# Patient Record
Sex: Female | Born: 1970 | Race: White | Hispanic: No | Smoking: Current every day smoker
Health system: Southern US, Community
[De-identification: ages and names within clinical notes are randomized; demographics above are authoritative.]

## PROBLEM LIST (undated history)

## (undated) DIAGNOSIS — Z9889 Other specified postprocedural states: Secondary | ICD-10-CM

## (undated) DIAGNOSIS — D6800 Von Willebrand disease, unspecified: Secondary | ICD-10-CM

## (undated) DIAGNOSIS — I10 Essential (primary) hypertension: Secondary | ICD-10-CM

## (undated) DIAGNOSIS — E785 Hyperlipidemia, unspecified: Secondary | ICD-10-CM

## (undated) DIAGNOSIS — F419 Anxiety disorder, unspecified: Secondary | ICD-10-CM

## (undated) DIAGNOSIS — E119 Type 2 diabetes mellitus without complications: Secondary | ICD-10-CM

## (undated) DIAGNOSIS — R112 Nausea with vomiting, unspecified: Secondary | ICD-10-CM

## (undated) DIAGNOSIS — K219 Gastro-esophageal reflux disease without esophagitis: Secondary | ICD-10-CM

## (undated) HISTORY — DX: Hyperlipidemia, unspecified: E78.5

## (undated) HISTORY — PX: CHOLECYSTECTOMY: SHX55

## (undated) HISTORY — PX: MOUTH SURGERY: SHX715

## (undated) HISTORY — PX: TONSILLECTOMY: SUR1361

---

## 1998-04-28 ENCOUNTER — Other Ambulatory Visit: Admission: RE | Admit: 1998-04-28 | Discharge: 1998-04-28 | Payer: Self-pay | Admitting: Gynecology

## 2001-10-22 ENCOUNTER — Other Ambulatory Visit: Admission: RE | Admit: 2001-10-22 | Discharge: 2001-10-22 | Payer: Self-pay | Admitting: Gynecology

## 2002-11-24 ENCOUNTER — Other Ambulatory Visit: Admission: RE | Admit: 2002-11-24 | Discharge: 2002-11-24 | Payer: Self-pay | Admitting: Gynecology

## 2003-12-22 ENCOUNTER — Other Ambulatory Visit: Admission: RE | Admit: 2003-12-22 | Discharge: 2003-12-22 | Payer: Self-pay | Admitting: Gynecology

## 2004-11-09 ENCOUNTER — Ambulatory Visit: Payer: Self-pay | Admitting: Family Medicine

## 2005-03-20 ENCOUNTER — Other Ambulatory Visit: Admission: RE | Admit: 2005-03-20 | Discharge: 2005-03-20 | Payer: Self-pay | Admitting: Gynecology

## 2005-04-05 ENCOUNTER — Encounter: Admission: RE | Admit: 2005-04-05 | Discharge: 2005-04-05 | Payer: Self-pay | Admitting: Gynecology

## 2005-06-14 ENCOUNTER — Ambulatory Visit: Payer: Self-pay | Admitting: Internal Medicine

## 2006-05-08 ENCOUNTER — Other Ambulatory Visit: Admission: RE | Admit: 2006-05-08 | Discharge: 2006-05-08 | Payer: Self-pay | Admitting: Gynecology

## 2007-05-13 ENCOUNTER — Other Ambulatory Visit: Admission: RE | Admit: 2007-05-13 | Discharge: 2007-05-13 | Payer: Self-pay | Admitting: Gynecology

## 2010-01-28 ENCOUNTER — Emergency Department (HOSPITAL_COMMUNITY): Admission: EM | Admit: 2010-01-28 | Discharge: 2010-01-28 | Payer: Self-pay | Admitting: Emergency Medicine

## 2010-04-19 ENCOUNTER — Encounter: Admission: RE | Admit: 2010-04-19 | Discharge: 2010-04-19 | Payer: Self-pay | Admitting: Internal Medicine

## 2010-12-11 ENCOUNTER — Encounter: Payer: Self-pay | Admitting: Internal Medicine

## 2011-02-13 LAB — URINE MICROSCOPIC-ADD ON

## 2011-02-13 LAB — POCT CARDIAC MARKERS
CKMB, poc: <1 ng/mL — ABNORMAL LOW (ref 1.0–8.0)
Myoglobin, poc: 77.7 ng/mL (ref 12–200)
Troponin i, poc: <0.05 ng/mL (ref 0.00–0.09)

## 2011-02-13 LAB — DIFFERENTIAL
Eosinophils Absolute: 0.2 10*3/uL (ref 0.0–0.7)
Lymphs Abs: 2.7 10*3/uL (ref 0.7–4.0)
Neutro Abs: 4.7 10*3/uL (ref 1.7–7.7)
Neutrophils Relative %: 56 % (ref 43–77)

## 2011-02-13 LAB — BASIC METABOLIC PANEL
BUN: 9 mg/dL (ref 6–23)
CO2: 27 mEq/L (ref 19–32)
Calcium: 9.2 mg/dL (ref 8.4–10.5)
Creatinine, Ser: 0.83 mg/dL (ref 0.4–1.2)
GFR calc Af Amer: >60 mL/min (ref >60)

## 2011-02-13 LAB — URINALYSIS, ROUTINE W REFLEX MICROSCOPIC
Bilirubin Urine: NEGATIVE
Glucose, UA: NEGATIVE mg/dL
Specific Gravity, Urine: 1.03 (ref 1.005–1.030)
pH: 5 (ref 5.0–8.0)

## 2011-02-13 LAB — CBC
MCHC: 33.5 g/dL (ref 30.0–36.0)
Platelets: 186 10*3/uL (ref 150–400)
RBC: 5.44 MIL/uL — ABNORMAL HIGH (ref 3.87–5.11)
RDW: 13.7 % (ref 11.5–15.5)

## 2013-10-02 ENCOUNTER — Emergency Department (HOSPITAL_COMMUNITY)
Admission: EM | Admit: 2013-10-02 | Discharge: 2013-10-02 | Disposition: A | Discharge status: Home / Self Care | Payer: 59 | Attending: Emergency Medicine | Admitting: Emergency Medicine

## 2013-10-02 ENCOUNTER — Emergency Department (HOSPITAL_COMMUNITY): Payer: 59

## 2013-10-02 ENCOUNTER — Encounter (HOSPITAL_COMMUNITY): Payer: Self-pay | Admitting: Emergency Medicine

## 2013-10-02 DIAGNOSIS — W1809XA Striking against other object with subsequent fall, initial encounter: Secondary | ICD-10-CM | POA: Insufficient documentation

## 2013-10-02 DIAGNOSIS — I10 Essential (primary) hypertension: Secondary | ICD-10-CM | POA: Insufficient documentation

## 2013-10-02 DIAGNOSIS — Y9289 Other specified places as the place of occurrence of the external cause: Secondary | ICD-10-CM | POA: Insufficient documentation

## 2013-10-02 DIAGNOSIS — S060X9A Concussion with loss of consciousness of unspecified duration, initial encounter: Secondary | ICD-10-CM | POA: Insufficient documentation

## 2013-10-02 DIAGNOSIS — F17200 Nicotine dependence, unspecified, uncomplicated: Secondary | ICD-10-CM | POA: Insufficient documentation

## 2013-10-02 DIAGNOSIS — Y939 Activity, unspecified: Secondary | ICD-10-CM | POA: Insufficient documentation

## 2013-10-02 DIAGNOSIS — Z79899 Other long term (current) drug therapy: Secondary | ICD-10-CM | POA: Insufficient documentation

## 2013-10-02 DIAGNOSIS — R112 Nausea with vomiting, unspecified: Secondary | ICD-10-CM | POA: Insufficient documentation

## 2013-10-02 DIAGNOSIS — S069X9A Unspecified intracranial injury with loss of consciousness of unspecified duration, initial encounter: Secondary | ICD-10-CM

## 2013-10-02 HISTORY — DX: Essential (primary) hypertension: I10

## 2013-10-02 MED ORDER — HYDROCODONE-ACETAMINOPHEN 5-325 MG PO TABS: 1.0000 | ORAL_TABLET | Freq: Four times a day (QID) | ORAL | Status: DC | PRN

## 2013-10-02 MED ORDER — IBUPROFEN 600 MG PO TABS: 600.0000 mg | ORAL_TABLET | Freq: Four times a day (QID) | ORAL | Status: DC | PRN

## 2013-10-02 NOTE — ED Provider Notes (Signed)
CSN: [id number]     Arrival date & time 10/02/13  1144 History   First MD Initiated Contact with Patient 10/02/13 1157     No chief complaint on file.  (Consider location/radiation/quality/duration/timing/severity/associated sxs/prior Treatment) HPI  This a 42 year old female who presents following a fall yesterday. She fell from a standing height and hit her head on a rock wall. She didn't lose consciousness. She had one episode of emesis approximately 20 minutes following a fall. Since that time she has had increasing left-sided headache it is worse with jaw opening. She denies any vision changes, weakness, numbness or tingling. She denies any further episodes of emesis but does endorse nausea. Patient denies any other injury. She called her primary care physician this morning who referred her to the ER for CT scan.  Past Medical History  Diagnosis Date  . Hypertension    Past Surgical History  Procedure Laterality Date  . Tonsillectomy    . Mouth surgery     No family history on file. History  Substance Use Topics  . Smoking status: Current Every Day Smoker -- 0.50 packs/day    Types: Cigarettes  . Smokeless tobacco: Not on file  . Alcohol Use: Yes   OB History   Grav Para Term Preterm Abortions TAB SAB Ect Mult Living                 Review of Systems  Respiratory: Negative for chest tightness and shortness of breath.   Cardiovascular: Negative for chest pain.  Gastrointestinal: Positive for nausea and vomiting. Negative for abdominal distention.  Musculoskeletal: Negative for neck pain.  Skin: Negative for wound.  Neurological: Positive for headaches.    Allergies  Phenergan  Home Medications   Current Outpatient Rx  Name  Route  Sig  Dispense  Refill  . acetaminophen (TYLENOL) 325 MG tablet   Oral   Take 650 mg by mouth every 6 (six) hours as needed.         . citalopram (CELEXA) 10 MG tablet   Oral   Take 10 mg by mouth daily.         Marland Kitchen etonogestrel  (IMPLANON) 68 MG IMPL implant   Subcutaneous   Inject 1 each into the skin once.         Marland Kitchen losartan (COZAAR) 25 MG tablet   Oral   Take 25 mg by mouth daily.         Marland Kitchen omeprazole (PRILOSEC) 40 MG capsule   Oral   Take 40 mg by mouth daily.         Marland Kitchen HYDROcodone-acetaminophen (NORCO/VICODIN) 5-325 MG per tablet   Oral   Take 1 tablet by mouth every 6 (six) hours as needed.   10 tablet   0   . ibuprofen (ADVIL,MOTRIN) 600 MG tablet   Oral   Take 1 tablet (600 mg total) by mouth every 6 (six) hours as needed.   30 tablet   0    BP 158/63  Pulse 88  Temp(Src) 98.3 F (36.8 C)  Resp 18  SpO2 96% Physical Exam  Nursing note and vitals reviewed. Constitutional: She is oriented to person, place, and time. She appears well-developed and well-nourished. No distress.  HENT:  Head: Normocephalic and atraumatic.  No evidence of hematoma or contusion to the left scalp or 4 head. There is tenderness to palpation. Midface is stable. Jaw appears aligned and there is no evidence of trismus on exam.  Eyes: EOM are normal. Pupils  are equal, round, and reactive to light.  Neck: Neck supple.  No cervical spine tenderness  Cardiovascular: Normal rate, regular rhythm and normal heart sounds.   No murmur heard. Pulmonary/Chest: Effort normal and breath sounds normal. No respiratory distress.  Abdominal: Soft. There is no tenderness.  Neurological: She is alert and oriented to person, place, and time.  5 Out of 5 strength in all 4 extremity  Skin: Skin is warm and dry.  Psychiatric: She has a normal mood and affect.    ED Course  Procedures (including critical care time) Labs Review Labs Reviewed - No data to display Imaging Review Ct Head Wo Contrast  10/02/2013   CLINICAL DATA:  Fall with loss consciousness and vomiting  EXAM: CT HEAD WITHOUT CONTRAST  TECHNIQUE: Contiguous axial images were obtained from the base of the skull through the vertex without intravenous contrast.   COMPARISON:  CT 01/28/2010  FINDINGS: Ventricle size is normal. Negative for acute or chronic infarction. Negative for hemorrhage or fluid collection. Negative for mass or edema. No shift of the midline structures.  Calvarium is intact.  IMPRESSION: Normal   Electronically Signed   By: Marlan Palau M.D.   On: 10/02/2013 13:14    EKG Interpretation   None       MDM   1. Minor head injury with loss of consciousness, initial encounter    Patient presents following a fall approximately 14 hours ago. She's not on any anticoagulants. However, she did have loss of consciousness and has had one episode of vomiting. She is nontoxic on exam and vital signs are reassuring. She has no evidence of hematoma or laceration to the scalp. I have low suspicion for intracranial pathology at this time. However, given that she has vomited and was referred by her primary doctor for CT scanning, patient was sent for CT scan of the head. CT is negative. She continues to be neurologically intact. Patient will followup with her primary doctor. She will be given a short course of pain medication.  After history, exam, and medical workup I feel the patient has been appropriately medically screened and is safe for discharge home. Pertinent diagnoses were discussed with the patient. Patient was given return precautions.     Shon Baton, MD 10/02/13 1344

## 2013-10-02 NOTE — ED Notes (Signed)
Pt sts fell yesterday in her garden and hit left side of her head on a rock. Pt sts LOC for short time. Today she called her PCP and was told to come to ED for scans. Pt reports swelling and pain on the left side of head as well as nausea.

## 2013-10-02 NOTE — ED Notes (Signed)
Pt states she fell, hit her head and lost consciousness last night at 10pm. Vomited once after fall. Minor abrasion to left side of scalp. Pt reports swelling. Called her pcp her advised her to come into ED for CT scan

## 2015-12-26 ENCOUNTER — Other Ambulatory Visit: Payer: Self-pay

## 2015-12-26 ENCOUNTER — Emergency Department (HOSPITAL_COMMUNITY)
Admission: EM | Admit: 2015-12-26 | Discharge: 2015-12-27 | Disposition: A | Discharge status: Home / Self Care | Payer: Self-pay | Attending: Emergency Medicine | Admitting: Emergency Medicine

## 2015-12-26 ENCOUNTER — Emergency Department (HOSPITAL_COMMUNITY): Payer: Self-pay

## 2015-12-26 ENCOUNTER — Encounter (HOSPITAL_COMMUNITY): Payer: Self-pay | Admitting: Emergency Medicine

## 2015-12-26 DIAGNOSIS — I10 Essential (primary) hypertension: Secondary | ICD-10-CM | POA: Insufficient documentation

## 2015-12-26 DIAGNOSIS — F17210 Nicotine dependence, cigarettes, uncomplicated: Secondary | ICD-10-CM | POA: Insufficient documentation

## 2015-12-26 DIAGNOSIS — K219 Gastro-esophageal reflux disease without esophagitis: Secondary | ICD-10-CM | POA: Insufficient documentation

## 2015-12-26 DIAGNOSIS — Z79899 Other long term (current) drug therapy: Secondary | ICD-10-CM | POA: Insufficient documentation

## 2015-12-26 DIAGNOSIS — K8020 Calculus of gallbladder without cholecystitis without obstruction: Secondary | ICD-10-CM | POA: Insufficient documentation

## 2015-12-26 DIAGNOSIS — R011 Cardiac murmur, unspecified: Secondary | ICD-10-CM | POA: Insufficient documentation

## 2015-12-26 MED ORDER — ONDANSETRON HCL 4 MG/2ML IJ SOLN
4.0000 mg | Freq: Once | INTRAMUSCULAR | Status: AC
  Administered 2015-12-26: 4 mg via INTRAVENOUS
  Filled 2015-12-26: qty 2

## 2015-12-26 MED ORDER — SODIUM CHLORIDE 0.9 % IV BOLUS (SEPSIS)
500.0000 mL | Freq: Once | INTRAVENOUS | Status: AC
  Administered 2015-12-27: 500 mL via INTRAVENOUS

## 2015-12-26 MED ORDER — HYDROMORPHONE HCL 1 MG/ML IJ SOLN
1.0000 mg | Freq: Once | INTRAMUSCULAR | Status: AC
  Administered 2015-12-27: 1 mg via INTRAVENOUS
  Filled 2015-12-26: qty 1

## 2015-12-26 NOTE — ED Notes (Signed)
Per EMS pt reported that she began to experience epigastric pain, two hours prior she had eaten a slice of pizza and had a glass of wine.  Antiacids has not worked to relieve pain, she also reports that she has "thrown up 6X".  EMS gave her 1 nitro and 324 ASA which decreased her pain from a 10 out of 10 to a 7 out of 10.

## 2015-12-26 NOTE — ED Provider Notes (Signed)
CSN: [id number]     Arrival date & time 12/26/15  2253 History  By signing my name below, I, Budd Palmer, attest that this documentation has been prepared under the direction and in the presence of Gilda Crease, MD. Electronically Signed: Budd Palmer, ED Scribe. 12/27/2015. 12:02 AM.    Chief Complaint  Patient presents with  . Abdominal Pain   The history is provided by the patient. No language interpreter was used.   HPI Comments: Donna Freeman is a 45 y.o. female smoker at 0.5 ppd with a PMHx of HTN brought in by ambulance, who presents to the Emergency Department complaining of non-radiating, epigastric abdominal soreness of sudden onset less than an hour ago. Pt states she was watching TV when the pain began. She notes she ate some pizza over an hour before onset. She reports associated vomiting (6x) and SOB, which has since resolved. She denies any exacerbating or alleviating factors. She reports a PMhx of GERD for which she takes medication daily. She notes a FHx of heart problems and a personal PMHx of heart murmur. Pt denies a PMHx of DM and a PSHx of cholecystectomy.  Pt is allergic to phenergan.   Past Medical History  Diagnosis Date  . Hypertension    Past Surgical History  Procedure Laterality Date  . Tonsillectomy    . Mouth surgery     No family history on file. Social History  Substance Use Topics  . Smoking status: Current Every Day Smoker -- 0.50 packs/day    Types: Cigarettes  . Smokeless tobacco: None  . Alcohol Use: Yes   OB History    No data available     Review of Systems  Respiratory: Positive for shortness of breath (resolved).   Gastrointestinal: Positive for vomiting and abdominal pain.  All other systems reviewed and are negative.  Allergies  Phenergan  Home Medications   Prior to Admission medications   Medication Sig Start Date End Date Taking? Authorizing Provider  acetaminophen (TYLENOL) 325 MG tablet Take 650 mg by mouth  every 6 (six) hours as needed for mild pain.    Yes Historical Provider, MD  cetirizine (ZYRTEC) 10 MG tablet Take 10 mg by mouth daily.   Yes Historical Provider, MD  citalopram (CELEXA) 10 MG tablet Take 10 mg by mouth daily.   Yes Historical Provider, MD  etonogestrel (IMPLANON) 68 MG IMPL implant Inject 1 each into the skin once.   Yes Historical Provider, MD  losartan (COZAAR) 25 MG tablet Take 25 mg by mouth daily.   Yes Historical Provider, MD  omeprazole (PRILOSEC) 40 MG capsule Take 40 mg by mouth daily.   Yes Historical Provider, MD  HYDROcodone-acetaminophen (NORCO/VICODIN) 5-325 MG tablet Take 2 tablets by mouth every 4 (four) hours as needed for moderate pain. 12/27/15   Gilda Crease, MD  ondansetron (ZOFRAN) 4 MG tablet Take 1 tablet (4 mg total) by mouth every 6 (six) hours. 12/27/15   Gilda Crease, MD   BP 144/74 mmHg  Pulse 73  Temp(Src) 98.2 F (36.8 C) (Oral)  Resp 17  SpO2 96% Physical Exam  Constitutional: She is oriented to person, place, and time. She appears well-developed and well-nourished. No distress.  HENT:  Head: Normocephalic and atraumatic.  Right Ear: Hearing normal.  Left Ear: Hearing normal.  Nose: Nose normal.  Mouth/Throat: Oropharynx is clear and moist and mucous membranes are normal.  Eyes: Conjunctivae and EOM are normal. Pupils are equal, round, and reactive  to light.  Neck: Normal range of motion. Neck supple.  Cardiovascular: Regular rhythm, S1 normal and S2 normal.  Exam reveals no gallop and no friction rub.   No murmur heard. Pulmonary/Chest: Effort normal and breath sounds normal. No respiratory distress. She exhibits no tenderness.  Abdominal: Soft. Normal appearance and bowel sounds are normal. There is no hepatosplenomegaly. There is tenderness. There is no rebound, no guarding, no tenderness at McBurney's point and negative Murphy's sign. No hernia.  Epigastric TTP  Musculoskeletal: Normal range of motion.  Neurological:  She is alert and oriented to person, place, and time. She has normal strength. No cranial nerve deficit or sensory deficit. Coordination normal. GCS eye subscore is 4. GCS verbal subscore is 5. GCS motor subscore is 6.  Skin: Skin is warm, dry and intact. No rash noted. No cyanosis.  Psychiatric: She has a normal mood and affect. Her speech is normal and behavior is normal. Thought content normal.  Nursing note and vitals reviewed.   ED Course  Procedures  DIAGNOSTIC STUDIES: Oxygen Saturation is 95% on RA, adequate by my interpretation.    COORDINATION OF CARE: 11:18 PM - Discussed plans to order diagnostic studies and imaging. Pt advised of plan for treatment and pt agrees.  Labs Review Labs Reviewed  CBC WITH DIFFERENTIAL/PLATELET - Abnormal; Notable for the following:    RBC 5.69 (*)    Hemoglobin 16.1 (*)    HCT 48.6 (*)    All other components within normal limits  COMPREHENSIVE METABOLIC PANEL - Abnormal; Notable for the following:    Potassium 3.2 (*)    Glucose, Bld 128 (*)    All other components within normal limits  URINALYSIS, ROUTINE W REFLEX MICROSCOPIC (NOT AT [facility]) - Abnormal; Notable for the following:    Color, Urine AMBER (*)    APPearance CLOUDY (*)    Hgb urine dipstick SMALL (*)    Bilirubin Urine SMALL (*)    Ketones, ur 15 (*)    All other components within normal limits  URINE MICROSCOPIC-ADD ON - Abnormal; Notable for the following:    Squamous Epithelial / LPF 6-30 (*)    Bacteria, UA RARE (*)    All other components within normal limits  LIPASE, BLOOD  TROPONIN I  TROPONIN I    Imaging Review Dg Abd Acute W/chest  12/26/2015  CLINICAL DATA:  45 year old female with epigastric pain EXAM: DG ABDOMEN ACUTE W/ 1V CHEST COMPARISON:  Chest radiograph dated 01/28/2010 FINDINGS: Moderate stool throughout the colon. There is no evidence of dilated bowel loops or free intraperitoneal air. No radiopaque calculi or other significant radiographic abnormality  is seen. Heart size and mediastinal contours are within normal limits. Both lungs are clear. IMPRESSION: Negative abdominal radiographs.  No acute cardiopulmonary disease. Electronically Signed   By: Elgie Collard M.D.   On: 12/26/2015 23:50   US Abdomen Limited Ruq  12/27/2015  CLINICAL DATA:  45 year old female with upper abdominal pain EXAM: US ABDOMEN LIMITED - RIGHT UPPER QUADRANT COMPARISON:  None. FINDINGS: Gallbladder: There are stones within the gallbladder. Eight stone is noted in the gallbladder neck. The gallbladder wall is thickened and measures 5 mm in diameter. Areas of increased gallbladder wall echogenicity with comet tail artifact compatible with adenomyomatosis. There is no pericholecystic fluid. Negative sonographic Murphy's sign. Common bile duct: Diameter: 6 mm Liver: No focal lesion identified. Within normal limits in parenchymal echogenicity. IMPRESSION: Cholelithiasis with thickening of the gallbladder wall may represent an acute or chronic cholecystitis.  A hepatobiliary scintigraphy may provide better evaluation of the gallbladder if an acute cholecystitis is clinically suspected. Electronically Signed   By: Elgie Collard M.D.   On: 12/27/2015 03:09   I have personally reviewed and evaluated these images and lab results as part of my medical decision-making.   EKG Interpretation   Date/Time:  Sunday December 26 2015 22:52:41 EST Ventricular Rate:  73 PR Interval:  144 QRS Duration: 90 QT Interval:  422 QTC Calculation: 464 R Axis:   98 Text Interpretation:  Normal sinus rhythm Rightward axis No significant  change since last tracing Confirmed by Edwing Figley  MD, Cletis Clack [id number]) on  12/27/2015 4:02:31 AM      MDM   Final diagnoses:  Calculus of gallbladder without cholecystitis without obstruction   01:05 - recheck, patient not experiencing pain after dilaudid.  Patient presented to the emergency department for evaluation of upper abdominal pain. Patient had  sudden onset of sharp and stabbing pains in the center of her upper abdomen prior to arrival. This was accompanied with nausea and vomiting. She concerned because she has a significant family history of heart disease. Presentation was atypical for cardiac etiology. Her EKG, troponin 2 have been negative.  Based on the epigastric discomfort, gallbladder disease was considered. Ultrasound does show cholelithiasis with slightly thickened gallbladder wall of 5 mm. Patient presentation is not consistent with acute cholecystitis at this time. She has had complete resolution of pain with a single dose of Dilaudid. Repeat examination is benign, no Murphy's sign. She does not require hospitalization or emergent surgery for this. She is appropriate for outpatient follow-up with general surgery. Patient was provided antiemetics, analgesia. She was counseled return to the ER for any recurrent episodes of pain.  I personally performed the services described in this documentation, which was scribed in my presence. The recorded information has been reviewed and is accurate.   Gilda Crease, MD 12/27/15 [phone]

## 2015-12-26 NOTE — ED Notes (Signed)
B

## 2015-12-27 ENCOUNTER — Emergency Department (HOSPITAL_COMMUNITY): Payer: Self-pay

## 2015-12-27 LAB — CBC WITH DIFFERENTIAL/PLATELET
BASOS ABS: 0 10*3/uL (ref 0.0–0.1)
BASOS PCT: 0 %
EOS ABS: 0.1 10*3/uL (ref 0.0–0.7)
Eosinophils Relative: 1 %
HCT: 48.6 % — ABNORMAL HIGH (ref 36.0–46.0)
Hemoglobin: 16.1 g/dL — ABNORMAL HIGH (ref 12.0–15.0)
Lymphocytes Relative: 24 %
Lymphs Abs: 2.4 10*3/uL (ref 0.7–4.0)
MCH: 28.3 pg (ref 26.0–34.0)
MCHC: 33.1 g/dL (ref 30.0–36.0)
MCV: 85.4 fL (ref 78.0–100.0)
MONO ABS: 0.8 10*3/uL (ref 0.1–1.0)
Monocytes Relative: 8 %
NEUTROS PCT: 67 %
Neutro Abs: 6.8 10*3/uL (ref 1.7–7.7)
PLATELETS: 219 10*3/uL (ref 150–400)
RBC: 5.69 MIL/uL — ABNORMAL HIGH (ref 3.87–5.11)
RDW: 13.7 % (ref 11.5–15.5)
WBC: 10.1 10*3/uL (ref 4.0–10.5)

## 2015-12-27 LAB — TROPONIN I
Troponin I: <0.03 ng/mL (ref <0.031)
Troponin I: <0.03 ng/mL (ref <0.031)

## 2015-12-27 LAB — COMPREHENSIVE METABOLIC PANEL
ALBUMIN: 4.1 g/dL (ref 3.5–5.0)
ALK PHOS: 69 U/L (ref 38–126)
ALT: 23 U/L (ref 14–54)
AST: 28 U/L (ref 15–41)
Anion gap: 15 (ref 5–15)
BILIRUBIN TOTAL: 0.6 mg/dL (ref 0.3–1.2)
BUN: 7 mg/dL (ref 6–20)
CALCIUM: 9.6 mg/dL (ref 8.9–10.3)
CO2: 24 mmol/L (ref 22–32)
CREATININE: 0.96 mg/dL (ref 0.44–1.00)
Chloride: 102 mmol/L (ref 101–111)
GFR calc Af Amer: >60 mL/min (ref >60)
GLUCOSE: 128 mg/dL — AB (ref 65–99)
POTASSIUM: 3.2 mmol/L — AB (ref 3.5–5.1)
SODIUM: 141 mmol/L (ref 135–145)
TOTAL PROTEIN: 6.8 g/dL (ref 6.5–8.1)

## 2015-12-27 LAB — URINALYSIS, ROUTINE W REFLEX MICROSCOPIC
GLUCOSE, UA: NEGATIVE mg/dL
KETONES UR: 15 mg/dL — AB
LEUKOCYTES UA: NEGATIVE
Nitrite: NEGATIVE
PH: 5 (ref 5.0–8.0)
PROTEIN: NEGATIVE mg/dL
Specific Gravity, Urine: 1.026 (ref 1.005–1.030)

## 2015-12-27 LAB — LIPASE, BLOOD: LIPASE: 22 U/L (ref 11–51)

## 2015-12-27 LAB — URINE MICROSCOPIC-ADD ON

## 2015-12-27 MED ORDER — ONDANSETRON HCL 4 MG PO TABS: 4.0000 mg | ORAL_TABLET | Freq: Four times a day (QID) | ORAL | Status: DC

## 2015-12-27 MED ORDER — HYDROCODONE-ACETAMINOPHEN 5-325 MG PO TABS: 2.0000 | ORAL_TABLET | ORAL | Status: DC | PRN

## 2015-12-27 NOTE — Discharge Instructions (Signed)

## 2015-12-28 ENCOUNTER — Other Ambulatory Visit: Payer: Self-pay | Admitting: Surgery

## 2015-12-28 ENCOUNTER — Encounter (HOSPITAL_COMMUNITY): Payer: Self-pay | Admitting: *Deleted

## 2015-12-28 NOTE — H&P (Signed)
Donna Freeman 12/28/2015 9:05 AM Location: [facility] Patient #: [id number] DOB: 04-Sep-1971 Single / Language: [place] / Race: White Female  Patient Care Team: Gaspar Garbe, MD as PCP - General (Internal Medicine) Karie Soda, MD as Consulting Physician (General Surgery)   History of Present Illness Donna Sportsman MD; 12/28/2015 9:59 AM) The patient is a 45 year old female who presents for evaluation of gall stones. Note for "Gall stones": Patient sent for surgical consultation by Dr. Alice Reichert with, [facility]. Concern for symptomatic gallstones.  Pleasant woman. Comes today with her mother. Had episode of severe epigastric pain yesterday. Thought she was having a heart attack. Went to the emergency room. Negative cardiac and pulmonary workup. Began to have severe nausea with vomiting. Had pizza and one that night. Usually does not have much problems with eating. Does have chronic heartburn and reflux that is well controlled with Prilosec only. She did have evidence of gallstones and some concern of inflammation. However her pain resolved with one dose of narcotic and nausea meds. She did not have leukocytosis. It was recommend that she consdier a cholecystectomy. She is wanting this thing out now.  She already has a bowel movement twice a day. She does smoke a little less than a pack a day. She can walk an hour without difficulty. She's never had abdominal surgeries. No change in diet. No personal nor family history of GI/colon cancer, inflammatory bowel disease, irritable bowel syndrome, allergy such as Celiac Sprue, dietary/dairy problems, colitis, ulcers nor gastritis. No recent sick contacts/gastroenteritis. No travel outside the country. No changes in diet. No dysphagia to solids or liquids. No significant heartburn or reflux. No hematochezia, hematemesis, coffee ground emesis. No evidence of prior gastric/peptic  ulceration.    CLINICAL DATA: 45 year old female with upper abdominal pain  EXAM: US ABDOMEN LIMITED - RIGHT UPPER QUADRANT  COMPARISON: None.  FINDINGS: Gallbladder:  There are stones within the gallbladder. Eight stone is noted in the gallbladder neck. The gallbladder wall is thickened and measures 5 mm in diameter. Areas of increased gallbladder wall echogenicity with comet tail artifact compatible with adenomyomatosis. There is no pericholecystic fluid. Negative sonographic Murphy's sign.  Common bile duct:  Diameter: 6 mm  Liver:  No focal lesion identified. Within normal limits in parenchymal echogenicity.  IMPRESSION: Cholelithiasis with thickening of the gallbladder wall may represent an acute or chronic cholecystitis. A hepatobiliary scintigraphy may provide better evaluation of the gallbladder if an acute cholecystitis is clinically suspected.   Electronically Signed By: Elgie Collard M.D. On: 12/27/2015 03:09   Other Problems Fay Records, CMA; 12/28/2015 9:05 AM) Anxiety Disorder Back Pain Cholelithiasis Gastroesophageal Reflux Disease Heart murmur High blood pressure Migraine Headache  Past Surgical History Fay Records, CMA; 12/28/2015 9:05 AM) Oral Surgery Tonsillectomy  Diagnostic Studies History Fay Records, CMA; 12/28/2015 9:05 AM) Colonoscopy never Mammogram within last year Pap Smear 1-5 years ago  Allergies Fay Records, CMA; 12/28/2015 9:06 AM) Phenergan *ANTIHISTAMINES*  Medication History Fay Records, CMA; 12/28/2015 9:07 AM) Omeprazole (  Capsule DR, Oral) Active. Losartan Potassium-HCTZ (50-12.5MG  Tablet, Oral) Active. Tylenol (  Tablet, Oral) Active. ZyrTEC (  Tablet, Oral) Active. CeleXA (  Tablet, Oral) Active. Implanon (  Implant, Subcutaneous) Active. Hydrocodone-Acetaminophen (5-325MG  Tablet, Oral) Active. Zofran (  Tablet, Oral) Active. Medications Reconciled  Social  History Fay Records, [place]; 12/28/2015 9:05 AM) Alcohol use Moderate alcohol use. Caffeine use Carbonated beverages, Coffee, Tea. Illicit drug use Remotely quit drug use. Tobacco use Current every day smoker.  Family History (  Fay Records, CMA; 12/28/2015 9:05 AM) Arthritis Mother. Cerebrovascular Accident Mother. Heart Disease Mother. Heart disease in female family member before age 47 Heart disease in female family member before age 91 Hypertension Mother. Migraine Headache Mother. Thyroid problems Mother.  Pregnancy / Birth History Fay Records, CMA; 12/28/2015 9:05 AM) Age at menarche 11 years. Contraceptive History Contraceptive implant, Depo-provera, Oral contraceptives. Gravida 0 Irregular periods     Review of Systems Fay Records CMA; 12/28/2015 9:05 AM) General Not Present- Appetite Loss, Chills, Fatigue, Fever, Night Sweats, Weight Gain and Weight Loss. Skin Not Present- Change in Wart/Mole, Dryness, Hives, Jaundice, New Lesions, Non-Healing Wounds, Rash and Ulcer. HEENT Not Present- Earache, Hearing Loss, Hoarseness, Nose Bleed, Oral Ulcers, Ringing in the Ears, Seasonal Allergies, Sinus Pain, Sore Throat, Visual Disturbances, Wears glasses/contact lenses and Yellow Eyes. Respiratory Not Present- Bloody sputum, Chronic Cough, Difficulty Breathing, Snoring and Wheezing. Breast Not Present- Breast Mass, Breast Pain, Nipple Discharge and Skin Changes. Cardiovascular Not Present- Chest Pain, Difficulty Breathing Lying Down, Leg Cramps, Palpitations, Rapid Heart Rate, Shortness of Breath and Swelling of Extremities. Gastrointestinal Present- Abdominal Pain and Nausea. Not Present- Bloating, Bloody Stool, Change in Bowel Habits, Chronic diarrhea, Constipation, Difficulty Swallowing, Excessive gas, Gets full quickly at meals, Hemorrhoids, Indigestion, Rectal Pain and Vomiting. Female Genitourinary Not Present- Frequency, Nocturia, Painful Urination, Pelvic Pain and  Urgency. Musculoskeletal Not Present- Back Pain, Joint Pain, Joint Stiffness, Muscle Pain, Muscle Weakness and Swelling of Extremities. Neurological Not Present- Decreased Memory, Fainting, Headaches, Numbness, Seizures, Tingling, Tremor, Trouble walking and Weakness. Psychiatric Not Present- Anxiety, Bipolar, Change in Sleep Pattern, Depression, Fearful and Frequent crying. Endocrine Not Present- Cold Intolerance, Excessive Hunger, Hair Changes, Heat Intolerance, Hot flashes and New Diabetes. Hematology Not Present- Easy Bruising, Excessive bleeding, Gland problems, HIV and Persistent Infections.  Vitals Fay Records CMA; 12/28/2015 9:07 AM) 12/28/2015 9:07 AM Weight: 207 lb Height: 67in Body Surface Area: 2.05 m Body Mass Index: 32.42 kg/m  Temp.: 97.37F(Temporal)  Pulse: 87 (Regular)  BP: 128/78 (Sitting, Left Arm, Standard)      Physical Exam Donna Sportsman MD; 12/28/2015 9:55 AM)  General Mental Status-Alert. General Appearance-Not in acute distress, Not Sickly. Orientation-Oriented X3. Hydration-Well hydrated. Voice-Normal.  Integumentary Global Assessment Upon inspection and palpation of skin surfaces of the - Axillae: non-tender, no inflammation or ulceration, no drainage. and Distribution of scalp and body hair is normal. General Characteristics Temperature - normal warmth is noted.  Head and Neck Head-normocephalic, atraumatic with no lesions or palpable masses. Face Global Assessment - atraumatic, no absence of expression. Neck Global Assessment - no abnormal movements, no bruit auscultated on the right, no bruit auscultated on the left, no decreased range of motion, non-tender. Trachea-midline. Thyroid Gland Characteristics - non-tender.  Eye Eyeball - Left-Extraocular movements intact, No Nystagmus. Eyeball - Right-Extraocular movements intact, No Nystagmus. Cornea - Left-No Hazy. Cornea - Right-No Hazy. Sclera/Conjunctiva  - Left-No scleral icterus, No Discharge. Sclera/Conjunctiva - Right-No scleral icterus, No Discharge. Pupil - Left-Direct reaction to light normal. Pupil - Right-Direct reaction to light normal. Note: Wears glasses  ENMT Ears Pinna - Left - no drainage observed, no generalized tenderness observed. Right - no drainage observed, no generalized tenderness observed. Nose and Sinuses External Inspection of the Nose - no destructive lesion observed. Inspection of the nares - Left - quiet respiration. Right - quiet respiration. Mouth and Throat Lips - Upper Lip - no fissures observed, no pallor noted. Lower Lip - no fissures observed, no pallor noted. Nasopharynx - no discharge  present. Oral Cavity/Oropharynx - Tongue - no dryness observed. Oral Mucosa - no cyanosis observed. Hypopharynx - no evidence of airway distress observed.  Chest and Lung Exam Inspection Movements - Normal and Symmetrical. Accessory muscles - No use of accessory muscles in breathing. Palpation Palpation of the chest reveals - Non-tender. Auscultation Breath sounds - Normal and Clear.  Cardiovascular Auscultation Rhythm - Regular. Murmurs & Other Heart Sounds - Auscultation of the heart reveals - No Murmurs and No Systolic Clicks.  Abdomen Inspection Inspection of the abdomen reveals - No Visible peristalsis and No Abnormal pulsations. Umbilicus - No Bleeding, No Urine drainage. Palpation/Percussion Palpation and Percussion of the abdomen reveal - Soft, Non Tender, No Rebound tenderness, No Rigidity (guarding) and No Cutaneous hyperesthesia. Note: Obese but soft. Mild epigastric and medial RIGHT upper quadrant discomfort but no Murphy sign. Rest of the abdomen soft nontender nondistended. No diastases. No umbilical hernia.  Female Genitourinary Sexual Maturity Tanner 5 - Adult hair pattern. Note: No vaginal bleeding nor discharge  Peripheral Vascular Upper Extremity Inspection - Left - No Cyanotic  nailbeds, Not Ischemic. Right - No Cyanotic nailbeds, Not Ischemic.  Neurologic Neurologic evaluation reveals -normal attention span and ability to concentrate, able to name objects and repeat phrases. Appropriate fund of knowledge , normal sensation and normal coordination. Mental Status Affect - not angry, not paranoid. Cranial Nerves-Normal Bilaterally. Gait-Normal.  Neuropsychiatric Mental status exam performed with findings of-able to articulate well with normal speech/language, rate, volume and coherence, thought content normal with ability to perform basic computations and apply abstract reasoning and no evidence of hallucinations, delusions, obsessions or homicidal/suicidal ideation.  Musculoskeletal Global Assessment Spine, Ribs and Pelvis - no instability, subluxation or laxity. Right Upper Extremity - no instability, subluxation or laxity.  Lymphatic Head & Neck  General Head & Neck Lymphatics: Bilateral - Description - No Localized lymphadenopathy. Axillary  General Axillary Region: Bilateral - Description - No Localized lymphadenopathy. Femoral & Inguinal  Generalized Femoral & Inguinal Lymphatics: Left - Description - No Localized lymphadenopathy. Right - Description - No Localized lymphadenopathy.    Assessment & Plan Donna Sportsman MD; 12/28/2015 9:56 AM)  CHRONIC CHOLECYSTITIS WITH CALCULUS (K80.10) Impression: History physical and ultrasound strongly suspicious for biliary colic. Rest of the differential diagnosis seems unlikely. I think she would benefit from cholecystectomy. They're wanting this done as soon as possible.  I recommend she stick to soups and liquids diet for the next 48 hours. Continue the Zofran. See if things will continue to calm down. She is much better now than she was in the ER yesterday. Her affect if she has unbearable symptoms, may need to be admitted for urgent cholecystectomy. However census is high right now. We'll try and calm  this down and do this soon electively.  Current Plans You are being scheduled for surgery - Our schedulers will call you.  You should hear from our office's scheduling department within 5 working days about the location, date, and time of surgery. We try to make accommodations for patient's preferences in scheduling surgery, but sometimes the OR schedule or the surgeon's schedule prevents [place] from making those accommodations.  If you have not heard from our office [phone]) in 5 working days, call the office and ask for your surgeon's nurse.  If you have other questions about your diagnosis, plan, or surgery, call the office and ask for your surgeon's nurse.  The anatomy & physiology of hepatobiliary & pancreatic function was discussed. The pathophysiology of gallbladder dysfunction was  discussed. Natural history risks without surgery was discussed. I feel the risks of no intervention will lead to serious problems that outweigh the operative risks; therefore, I recommended cholecystectomy to remove the pathology. I explained laparoscopic techniques with possible need for an open approach. Probable cholangiogram to evaluate the bilary tract was explained as well.  Risks such as bleeding, infection, abscess, leak, injury to other organs, need for further treatment, heart attack, death, and other risks were discussed. I noted a good likelihood this will help address the problem. Possibility that this will not correct all abdominal symptoms was explained. Goals of post-operative recovery were discussed as well. We will work to minimize complications. An educational handout further explaining the pathology and treatment options was given as well. Questions were answered. The patient expresses understanding & wishes to proceed with surgery.  Pt Education - Pamphlet Given - Laparoscopic Gallbladder Surgery: discussed with patient and provided information. Written instructions provided Pt Education -  [facility] Laparosopic Post Op HCI (Lillybeth Tal) Pt Education - [facility] Good Bowel Health (Nevelyn Mellott) Pt Education - Laparoscopic Cholecystectomy: gallbladder  Donna Freeman, M.D., F.A.C.S. Gastrointestinal and Minimally Invasive Surgery [facility] [address], [address] [place], [place] [address] [phone] Main / Paging

## 2015-12-30 ENCOUNTER — Ambulatory Visit (HOSPITAL_COMMUNITY): Payer: Self-pay | Admitting: Anesthesiology

## 2015-12-30 ENCOUNTER — Encounter (HOSPITAL_COMMUNITY): Payer: Self-pay | Admitting: *Deleted

## 2015-12-30 ENCOUNTER — Encounter (HOSPITAL_COMMUNITY)
Admission: RE | Disposition: A | Discharge status: Home / Self Care | Payer: Self-pay | Source: Ambulatory Visit | Attending: Surgery

## 2015-12-30 ENCOUNTER — Ambulatory Visit (HOSPITAL_COMMUNITY): Payer: Self-pay

## 2015-12-30 ENCOUNTER — Ambulatory Visit (HOSPITAL_COMMUNITY)
Admission: RE | Admit: 2015-12-30 | Discharge: 2015-12-30 | Disposition: A | Discharge status: Home / Self Care | Payer: Self-pay | Source: Ambulatory Visit | Attending: Surgery | Admitting: Surgery

## 2015-12-30 DIAGNOSIS — E669 Obesity, unspecified: Secondary | ICD-10-CM | POA: Insufficient documentation

## 2015-12-30 DIAGNOSIS — K219 Gastro-esophageal reflux disease without esophagitis: Secondary | ICD-10-CM | POA: Insufficient documentation

## 2015-12-30 DIAGNOSIS — I10 Essential (primary) hypertension: Secondary | ICD-10-CM | POA: Insufficient documentation

## 2015-12-30 DIAGNOSIS — F17200 Nicotine dependence, unspecified, uncomplicated: Secondary | ICD-10-CM | POA: Insufficient documentation

## 2015-12-30 DIAGNOSIS — Z79891 Long term (current) use of opiate analgesic: Secondary | ICD-10-CM | POA: Insufficient documentation

## 2015-12-30 DIAGNOSIS — F419 Anxiety disorder, unspecified: Secondary | ICD-10-CM | POA: Insufficient documentation

## 2015-12-30 DIAGNOSIS — Z6832 Body mass index (BMI) 32.0-32.9, adult: Secondary | ICD-10-CM | POA: Insufficient documentation

## 2015-12-30 DIAGNOSIS — K8012 Calculus of gallbladder with acute and chronic cholecystitis without obstruction: Secondary | ICD-10-CM | POA: Insufficient documentation

## 2015-12-30 DIAGNOSIS — Z419 Encounter for procedure for purposes other than remedying health state, unspecified: Secondary | ICD-10-CM

## 2015-12-30 DIAGNOSIS — Z79899 Other long term (current) drug therapy: Secondary | ICD-10-CM | POA: Insufficient documentation

## 2015-12-30 HISTORY — DX: Other specified postprocedural states: Z98.890

## 2015-12-30 HISTORY — DX: Gastro-esophageal reflux disease without esophagitis: K21.9

## 2015-12-30 HISTORY — DX: Anxiety disorder, unspecified: F41.9

## 2015-12-30 HISTORY — DX: Type 2 diabetes mellitus without complications: E11.9

## 2015-12-30 HISTORY — DX: Nausea with vomiting, unspecified: R11.2

## 2015-12-30 HISTORY — PX: LAPAROSCOPIC CHOLECYSTECTOMY SINGLE SITE WITH INTRAOPERATIVE CHOLANGIOGRAM: SHX6538

## 2015-12-30 HISTORY — DX: Von Willebrand disease, unspecified: D68.00

## 2015-12-30 HISTORY — DX: Von Willebrand disease: D68.0

## 2015-12-30 SURGERY — LAPAROSCOPIC CHOLECYSTECTOMY SINGLE SITE WITH INTRAOPERATIVE CHOLANGIOGRAM
Anesthesia: General

## 2015-12-30 MED ORDER — HYDROMORPHONE HCL 1 MG/ML IJ SOLN
INTRAMUSCULAR | Status: DC | PRN
  Administered 2015-12-30: 1 mg via INTRAVENOUS
  Administered 2015-12-30 (×2): 0.5 mg via INTRAVENOUS

## 2015-12-30 MED ORDER — NEOSTIGMINE METHYLSULFATE 10 MG/10ML IV SOLN
INTRAVENOUS | Status: AC
  Filled 2015-12-30: qty 1

## 2015-12-30 MED ORDER — GLYCOPYRROLATE 0.2 MG/ML IJ SOLN
INTRAMUSCULAR | Status: DC | PRN
  Administered 2015-12-30: .6 mg via INTRAVENOUS

## 2015-12-30 MED ORDER — LACTATED RINGERS IV SOLN: INTRAVENOUS | Status: DC

## 2015-12-30 MED ORDER — HYDROMORPHONE HCL 2 MG/ML IJ SOLN
INTRAMUSCULAR | Status: AC
  Filled 2015-12-30: qty 1

## 2015-12-30 MED ORDER — HYDROMORPHONE HCL 1 MG/ML IJ SOLN: 0.2500 mg | INTRAMUSCULAR | Status: DC | PRN

## 2015-12-30 MED ORDER — MIDAZOLAM HCL 2 MG/2ML IJ SOLN
INTRAMUSCULAR | Status: AC
  Filled 2015-12-30: qty 2

## 2015-12-30 MED ORDER — CEFAZOLIN SODIUM-DEXTROSE 2-3 GM-% IV SOLR
INTRAVENOUS | Status: AC
  Filled 2015-12-30: qty 50

## 2015-12-30 MED ORDER — LIDOCAINE HCL (CARDIAC) 20 MG/ML IV SOLN
INTRAVENOUS | Status: AC
  Filled 2015-12-30: qty 5

## 2015-12-30 MED ORDER — FENTANYL CITRATE (PF) 100 MCG/2ML IJ SOLN
INTRAMUSCULAR | Status: DC | PRN
  Administered 2015-12-30: 100 ug via INTRAVENOUS
  Administered 2015-12-30 (×3): 50 ug via INTRAVENOUS

## 2015-12-30 MED ORDER — BUPIVACAINE-EPINEPHRINE 0.25% -1:200000 IJ SOLN
INTRAMUSCULAR | Status: DC | PRN
  Administered 2015-12-30: 60 mL

## 2015-12-30 MED ORDER — LIP MEDEX EX OINT
TOPICAL_OINTMENT | CUTANEOUS | Status: AC
  Filled 2015-12-30: qty 7

## 2015-12-30 MED ORDER — BUPIVACAINE-EPINEPHRINE (PF) 0.25% -1:200000 IJ SOLN
INTRAMUSCULAR | Status: AC
  Filled 2015-12-30: qty 30

## 2015-12-30 MED ORDER — SODIUM CHLORIDE 0.9 % IR SOLN
Status: DC | PRN
  Administered 2015-12-30: 3000 mL

## 2015-12-30 MED ORDER — LIDOCAINE HCL (CARDIAC) 20 MG/ML IV SOLN
INTRAVENOUS | Status: DC | PRN
  Administered 2015-12-30: 50 mg via INTRAVENOUS

## 2015-12-30 MED ORDER — ROCURONIUM BROMIDE 100 MG/10ML IV SOLN
INTRAVENOUS | Status: AC
  Filled 2015-12-30: qty 1

## 2015-12-30 MED ORDER — PHENYLEPHRINE HCL 10 MG/ML IJ SOLN
INTRAMUSCULAR | Status: DC | PRN
  Administered 2015-12-30 (×3): 40 ug via INTRAVENOUS
  Administered 2015-12-30: 80 ug via INTRAVENOUS

## 2015-12-30 MED ORDER — DEXTROSE 5 % IV SOLN
2.0000 g | INTRAVENOUS | Status: DC | PRN
  Administered 2015-12-30: 2 g via INTRAVENOUS

## 2015-12-30 MED ORDER — METRONIDAZOLE IN NACL 5-0.79 MG/ML-% IV SOLN
INTRAVENOUS | Status: DC | PRN
  Administered 2015-12-30: 500 mg via INTRAVENOUS

## 2015-12-30 MED ORDER — CHLORHEXIDINE GLUCONATE 4 % EX LIQD: 1.0000 "application " | Freq: Once | CUTANEOUS | Status: DC

## 2015-12-30 MED ORDER — ONDANSETRON HCL 4 MG/2ML IJ SOLN
INTRAMUSCULAR | Status: AC
  Filled 2015-12-30: qty 2

## 2015-12-30 MED ORDER — BUPIVACAINE-EPINEPHRINE (PF) 0.5% -1:200000 IJ SOLN
INTRAMUSCULAR | Status: AC
  Filled 2015-12-30: qty 30

## 2015-12-30 MED ORDER — BUPIVACAINE-EPINEPHRINE (PF) 0.25% -1:200000 IJ SOLN
INTRAMUSCULAR | Status: AC
  Filled 2015-12-30: qty 60

## 2015-12-30 MED ORDER — DEXTROSE 5 % IV SOLN
INTRAVENOUS | Status: AC
  Filled 2015-12-30: qty 2

## 2015-12-30 MED ORDER — PROPOFOL 10 MG/ML IV BOLUS
INTRAVENOUS | Status: AC
  Filled 2015-12-30: qty 20

## 2015-12-30 MED ORDER — ONDANSETRON HCL 4 MG PO TABS
4.0000 mg | ORAL_TABLET | Freq: Four times a day (QID) | ORAL | Status: DC
Start: 2015-12-30 — End: 2019-06-17

## 2015-12-30 MED ORDER — MIDAZOLAM HCL 5 MG/5ML IJ SOLN
INTRAMUSCULAR | Status: DC | PRN
  Administered 2015-12-30: 2 mg via INTRAVENOUS

## 2015-12-30 MED ORDER — ONDANSETRON HCL 4 MG/2ML IJ SOLN: 4.0000 mg | Freq: Once | INTRAMUSCULAR | Status: DC | PRN

## 2015-12-30 MED ORDER — FENTANYL CITRATE (PF) 250 MCG/5ML IJ SOLN
INTRAMUSCULAR | Status: AC
  Filled 2015-12-30: qty 5

## 2015-12-30 MED ORDER — PROPOFOL 10 MG/ML IV BOLUS
INTRAVENOUS | Status: DC | PRN
  Administered 2015-12-30: 200 mg via INTRAVENOUS

## 2015-12-30 MED ORDER — IOHEXOL 300 MG/ML  SOLN
INTRAMUSCULAR | Status: DC | PRN
  Administered 2015-12-30: 7 mL

## 2015-12-30 MED ORDER — 0.9 % SODIUM CHLORIDE (POUR BTL) OPTIME
TOPICAL | Status: DC | PRN
  Administered 2015-12-30: 1000 mL

## 2015-12-30 MED ORDER — HYDROCODONE-ACETAMINOPHEN 7.5-325 MG PO TABS: 1.0000 | ORAL_TABLET | Freq: Once | ORAL | Status: DC | PRN

## 2015-12-30 MED ORDER — KETOROLAC TROMETHAMINE 30 MG/ML IJ SOLN
INTRAMUSCULAR | Status: DC | PRN
  Administered 2015-12-30: 30 mg via INTRAVENOUS

## 2015-12-30 MED ORDER — LACTATED RINGERS IV SOLN
INTRAVENOUS | Status: DC | PRN
  Administered 2015-12-30: 1000 mL
  Administered 2015-12-30: 11:00:00 via INTRAVENOUS

## 2015-12-30 MED ORDER — HYDROCODONE-ACETAMINOPHEN 5-325 MG PO TABS: 1.0000 | ORAL_TABLET | ORAL | Status: DC | PRN

## 2015-12-30 MED ORDER — ONDANSETRON HCL 4 MG/2ML IJ SOLN
INTRAMUSCULAR | Status: DC | PRN
  Administered 2015-12-30: 4 mg via INTRAVENOUS

## 2015-12-30 MED ORDER — NEOSTIGMINE METHYLSULFATE 10 MG/10ML IV SOLN
INTRAVENOUS | Status: DC | PRN
  Administered 2015-12-30: 4 mg via INTRAVENOUS

## 2015-12-30 MED ORDER — METRONIDAZOLE IN NACL 5-0.79 MG/ML-% IV SOLN
INTRAVENOUS | Status: AC
  Filled 2015-12-30: qty 100

## 2015-12-30 MED ORDER — LACTATED RINGERS IR SOLN
Status: DC | PRN
  Administered 2015-12-30: 1000 mL

## 2015-12-30 MED ORDER — GLYCOPYRROLATE 0.2 MG/ML IJ SOLN
INTRAMUSCULAR | Status: AC
  Filled 2015-12-30: qty 3

## 2015-12-30 MED ORDER — DEXAMETHASONE SODIUM PHOSPHATE 10 MG/ML IJ SOLN
INTRAMUSCULAR | Status: AC
  Filled 2015-12-30: qty 1

## 2015-12-30 MED ORDER — SUCCINYLCHOLINE CHLORIDE 20 MG/ML IJ SOLN
INTRAMUSCULAR | Status: DC | PRN
  Administered 2015-12-30: 100 mg via INTRAVENOUS
  Administered 2015-12-30: 50 mg via INTRAVENOUS

## 2015-12-30 SURGICAL SUPPLY — 40 items
APPLIER CLIP 5 13 M/L LIGAMAX5 (MISCELLANEOUS) ×2
APR CLP MED LRG 5 ANG JAW (MISCELLANEOUS) ×1
BAG SPEC RTRVL LRG 6X4 10 (ENDOMECHANICALS) ×1
CABLE HIGH FREQUENCY MONO STRZ (ELECTRODE) ×2 IMPLANT
CHLORAPREP W/TINT 26ML (MISCELLANEOUS) ×2 IMPLANT
CLIP APPLIE 5 13 M/L LIGAMAX5 (MISCELLANEOUS) ×1 IMPLANT
COVER MAYO STAND STRL (DRAPES) ×2 IMPLANT
COVER SURGICAL LIGHT HANDLE (MISCELLANEOUS) ×2 IMPLANT
DECANTER SPIKE VIAL GLASS SM (MISCELLANEOUS) ×2 IMPLANT
DRAIN CHANNEL 19F RND (DRAIN) IMPLANT
DRAPE C-ARM 42X120 X-RAY (DRAPES) ×2 IMPLANT
DRAPE LAPAROSCOPIC ABDOMINAL (DRAPES) ×2 IMPLANT
DRAPE WARM FLUID 44X44 (DRAPE) ×2 IMPLANT
DRSG TEGADERM 4X4.75 (GAUZE/BANDAGES/DRESSINGS) ×2 IMPLANT
ELECT REM PT RETURN 9FT ADLT (ELECTROSURGICAL) ×2
ELECTRODE REM PT RTRN 9FT ADLT (ELECTROSURGICAL) ×1 IMPLANT
ENDOLOOP SUT PDS II  0 18 (SUTURE) ×1
ENDOLOOP SUT PDS II 0 18 (SUTURE) IMPLANT
EVACUATOR SILICONE 100CC (DRAIN) IMPLANT
GAUZE SPONGE 2X2 8PLY STRL LF (GAUZE/BANDAGES/DRESSINGS) ×1 IMPLANT
GLOVE ECLIPSE 8.0 STRL XLNG CF (GLOVE) ×2 IMPLANT
GLOVE INDICATOR 8.0 STRL GRN (GLOVE) ×2 IMPLANT
GOWN STRL REUS W/TWL XL LVL3 (GOWN DISPOSABLE) ×4 IMPLANT
KIT BASIN OR (CUSTOM PROCEDURE TRAY) ×2 IMPLANT
MARKER SKIN DUAL TIP RULER LAB (MISCELLANEOUS) ×2 IMPLANT
PAD POSITIONING PINK XL (MISCELLANEOUS) ×2 IMPLANT
POUCH SPECIMEN RETRIEVAL 10MM (ENDOMECHANICALS) ×1 IMPLANT
SCISSORS LAP 5X35 DISP (ENDOMECHANICALS) IMPLANT
SET CHOLANGIOGRAPH MIX (MISCELLANEOUS) ×2 IMPLANT
SET IRRIG TUBING LAPAROSCOPIC (IRRIGATION / IRRIGATOR) ×2 IMPLANT
SHEARS HARMONIC ACE PLUS 36CM (ENDOMECHANICALS) ×2 IMPLANT
SPONGE GAUZE 2X2 STER 10/PKG (GAUZE/BANDAGES/DRESSINGS) ×1
SUT MNCRL AB 4-0 PS2 18 (SUTURE) ×2 IMPLANT
SUT PDS AB 1 CT1 27 (SUTURE) ×4 IMPLANT
SYR 20CC LL (SYRINGE) ×2 IMPLANT
TOWEL OR 17X26 10 PK STRL BLUE (TOWEL DISPOSABLE) ×2 IMPLANT
TOWEL OR NON WOVEN STRL DISP B (DISPOSABLE) ×1 IMPLANT
TRAY LAPAROSCOPIC (CUSTOM PROCEDURE TRAY) ×2 IMPLANT
TROCAR BLADELESS OPT 5 100 (ENDOMECHANICALS) ×2 IMPLANT
TROCAR BLADELESS OPT 5 150 (ENDOMECHANICALS) ×2 IMPLANT

## 2015-12-30 NOTE — Transfer of Care (Signed)
Immediate Anesthesia Transfer of Care Note  Patient: Donna Freeman  Procedure(s) Performed: Procedure(s): LAPAROSCOPIC CHOLECYSTECTOMY SINGLE SITE WITH INTRAOPERATIVE CHOLANGIOGRAM (N/A)  Patient Location: PACU  Anesthesia Type:General  Level of Consciousness:  sedated, patient cooperative and responds to stimulation  Airway & Oxygen Therapy:Patient Spontanous Breathing and Patient connected to face mask oxgen  Post-op Assessment:  Report given to PACU RN and Post -op Vital signs reviewed and stable  Post vital signs:  Reviewed and stable  Last Vitals:  Filed Vitals:   12/30/15 0953 12/30/15 1041  BP: 148/66 148/66  Pulse: 93 93  Temp: 36.9 C 36.9 C  Resp: 18 18    Complications: No apparent anesthesia complications

## 2015-12-30 NOTE — H&P (View-Only) (Signed)
Donna Freeman 12/28/2015 9:05 AM Location: [facility] Patient #: [id number] DOB: 04-Sep-1971 Single / Language: [place] / Race: White Female  Patient Care Team: Gaspar Garbe, MD as PCP - General (Internal Medicine) Karie Soda, MD as Consulting Physician (General Surgery)   History of Present Illness Donna Sportsman MD; 12/28/2015 9:59 AM) The patient is a 45 year old female who presents for evaluation of gall stones. Note for "Gall stones": Patient sent for surgical consultation by Dr. Alice Reichert with, [facility]. Concern for symptomatic gallstones.  Pleasant woman. Comes today with her mother. Had episode of severe epigastric pain yesterday. Thought she was having a heart attack. Went to the emergency room. Negative cardiac and pulmonary workup. Began to have severe nausea with vomiting. Had pizza and one that night. Usually does not have much problems with eating. Does have chronic heartburn and reflux that is well controlled with Prilosec only. She did have evidence of gallstones and some concern of inflammation. However her pain resolved with one dose of narcotic and nausea meds. She did not have leukocytosis. It was recommend that she consdier a cholecystectomy. She is wanting this thing out now.  She already has a bowel movement twice a day. She does smoke a little less than a pack a day. She can walk an hour without difficulty. She's never had abdominal surgeries. No change in diet. No personal nor family history of GI/colon cancer, inflammatory bowel disease, irritable bowel syndrome, allergy such as Celiac Sprue, dietary/dairy problems, colitis, ulcers nor gastritis. No recent sick contacts/gastroenteritis. No travel outside the country. No changes in diet. No dysphagia to solids or liquids. No significant heartburn or reflux. No hematochezia, hematemesis, coffee ground emesis. No evidence of prior gastric/peptic  ulceration.    CLINICAL DATA: 45 year old female with upper abdominal pain  EXAM: US ABDOMEN LIMITED - RIGHT UPPER QUADRANT  COMPARISON: None.  FINDINGS: Gallbladder:  There are stones within the gallbladder. Eight stone is noted in the gallbladder neck. The gallbladder wall is thickened and measures 5 mm in diameter. Areas of increased gallbladder wall echogenicity with comet tail artifact compatible with adenomyomatosis. There is no pericholecystic fluid. Negative sonographic Murphy's sign.  Common bile duct:  Diameter: 6 mm  Liver:  No focal lesion identified. Within normal limits in parenchymal echogenicity.  IMPRESSION: Cholelithiasis with thickening of the gallbladder wall may represent an acute or chronic cholecystitis. A hepatobiliary scintigraphy may provide better evaluation of the gallbladder if an acute cholecystitis is clinically suspected.   Electronically Signed By: Elgie Collard M.D. On: 12/27/2015 03:09   Other Problems Fay Records, CMA; 12/28/2015 9:05 AM) Anxiety Disorder Back Pain Cholelithiasis Gastroesophageal Reflux Disease Heart murmur High blood pressure Migraine Headache  Past Surgical History Fay Records, CMA; 12/28/2015 9:05 AM) Oral Surgery Tonsillectomy  Diagnostic Studies History Fay Records, CMA; 12/28/2015 9:05 AM) Colonoscopy never Mammogram within last year Pap Smear 1-5 years ago  Allergies Fay Records, CMA; 12/28/2015 9:06 AM) Phenergan *ANTIHISTAMINES*  Medication History Fay Records, CMA; 12/28/2015 9:07 AM) Omeprazole (  Capsule DR, Oral) Active. Losartan Potassium-HCTZ (50-12.5MG  Tablet, Oral) Active. Tylenol (  Tablet, Oral) Active. ZyrTEC (  Tablet, Oral) Active. CeleXA (  Tablet, Oral) Active. Implanon (  Implant, Subcutaneous) Active. Hydrocodone-Acetaminophen (5-325MG  Tablet, Oral) Active. Zofran (  Tablet, Oral) Active. Medications Reconciled  Social  History Fay Records, [place]; 12/28/2015 9:05 AM) Alcohol use Moderate alcohol use. Caffeine use Carbonated beverages, Coffee, Tea. Illicit drug use Remotely quit drug use. Tobacco use Current every day smoker.  Family History (  Fay Records, CMA; 12/28/2015 9:05 AM) Arthritis Mother. Cerebrovascular Accident Mother. Heart Disease Mother. Heart disease in female family member before age 47 Heart disease in female family member before age 91 Hypertension Mother. Migraine Headache Mother. Thyroid problems Mother.  Pregnancy / Birth History Fay Records, CMA; 12/28/2015 9:05 AM) Age at menarche 11 years. Contraceptive History Contraceptive implant, Depo-provera, Oral contraceptives. Gravida 0 Irregular periods     Review of Systems Fay Records CMA; 12/28/2015 9:05 AM) General Not Present- Appetite Loss, Chills, Fatigue, Fever, Night Sweats, Weight Gain and Weight Loss. Skin Not Present- Change in Wart/Mole, Dryness, Hives, Jaundice, New Lesions, Non-Healing Wounds, Rash and Ulcer. HEENT Not Present- Earache, Hearing Loss, Hoarseness, Nose Bleed, Oral Ulcers, Ringing in the Ears, Seasonal Allergies, Sinus Pain, Sore Throat, Visual Disturbances, Wears glasses/contact lenses and Yellow Eyes. Respiratory Not Present- Bloody sputum, Chronic Cough, Difficulty Breathing, Snoring and Wheezing. Breast Not Present- Breast Mass, Breast Pain, Nipple Discharge and Skin Changes. Cardiovascular Not Present- Chest Pain, Difficulty Breathing Lying Down, Leg Cramps, Palpitations, Rapid Heart Rate, Shortness of Breath and Swelling of Extremities. Gastrointestinal Present- Abdominal Pain and Nausea. Not Present- Bloating, Bloody Stool, Change in Bowel Habits, Chronic diarrhea, Constipation, Difficulty Swallowing, Excessive gas, Gets full quickly at meals, Hemorrhoids, Indigestion, Rectal Pain and Vomiting. Female Genitourinary Not Present- Frequency, Nocturia, Painful Urination, Pelvic Pain and  Urgency. Musculoskeletal Not Present- Back Pain, Joint Pain, Joint Stiffness, Muscle Pain, Muscle Weakness and Swelling of Extremities. Neurological Not Present- Decreased Memory, Fainting, Headaches, Numbness, Seizures, Tingling, Tremor, Trouble walking and Weakness. Psychiatric Not Present- Anxiety, Bipolar, Change in Sleep Pattern, Depression, Fearful and Frequent crying. Endocrine Not Present- Cold Intolerance, Excessive Hunger, Hair Changes, Heat Intolerance, Hot flashes and New Diabetes. Hematology Not Present- Easy Bruising, Excessive bleeding, Gland problems, HIV and Persistent Infections.  Vitals Fay Records CMA; 12/28/2015 9:07 AM) 12/28/2015 9:07 AM Weight: 207 lb Height: 67in Body Surface Area: 2.05 m Body Mass Index: 32.42 kg/m  Temp.: 97.37F(Temporal)  Pulse: 87 (Regular)  BP: 128/78 (Sitting, Left Arm, Standard)      Physical Exam Donna Sportsman MD; 12/28/2015 9:55 AM)  General Mental Status-Alert. General Appearance-Not in acute distress, Not Sickly. Orientation-Oriented X3. Hydration-Well hydrated. Voice-Normal.  Integumentary Global Assessment Upon inspection and palpation of skin surfaces of the - Axillae: non-tender, no inflammation or ulceration, no drainage. and Distribution of scalp and body hair is normal. General Characteristics Temperature - normal warmth is noted.  Head and Neck Head-normocephalic, atraumatic with no lesions or palpable masses. Face Global Assessment - atraumatic, no absence of expression. Neck Global Assessment - no abnormal movements, no bruit auscultated on the right, no bruit auscultated on the left, no decreased range of motion, non-tender. Trachea-midline. Thyroid Gland Characteristics - non-tender.  Eye Eyeball - Left-Extraocular movements intact, No Nystagmus. Eyeball - Right-Extraocular movements intact, No Nystagmus. Cornea - Left-No Hazy. Cornea - Right-No Hazy. Sclera/Conjunctiva  - Left-No scleral icterus, No Discharge. Sclera/Conjunctiva - Right-No scleral icterus, No Discharge. Pupil - Left-Direct reaction to light normal. Pupil - Right-Direct reaction to light normal. Note: Wears glasses  ENMT Ears Pinna - Left - no drainage observed, no generalized tenderness observed. Right - no drainage observed, no generalized tenderness observed. Nose and Sinuses External Inspection of the Nose - no destructive lesion observed. Inspection of the nares - Left - quiet respiration. Right - quiet respiration. Mouth and Throat Lips - Upper Lip - no fissures observed, no pallor noted. Lower Lip - no fissures observed, no pallor noted. Nasopharynx - no discharge  present. Oral Cavity/Oropharynx - Tongue - no dryness observed. Oral Mucosa - no cyanosis observed. Hypopharynx - no evidence of airway distress observed.  Chest and Lung Exam Inspection Movements - Normal and Symmetrical. Accessory muscles - No use of accessory muscles in breathing. Palpation Palpation of the chest reveals - Non-tender. Auscultation Breath sounds - Normal and Clear.  Cardiovascular Auscultation Rhythm - Regular. Murmurs & Other Heart Sounds - Auscultation of the heart reveals - No Murmurs and No Systolic Clicks.  Abdomen Inspection Inspection of the abdomen reveals - No Visible peristalsis and No Abnormal pulsations. Umbilicus - No Bleeding, No Urine drainage. Palpation/Percussion Palpation and Percussion of the abdomen reveal - Soft, Non Tender, No Rebound tenderness, No Rigidity (guarding) and No Cutaneous hyperesthesia. Note: Obese but soft. Mild epigastric and medial RIGHT upper quadrant discomfort but no Murphy sign. Rest of the abdomen soft nontender nondistended. No diastases. No umbilical hernia.  Female Genitourinary Sexual Maturity Tanner 5 - Adult hair pattern. Note: No vaginal bleeding nor discharge  Peripheral Vascular Upper Extremity Inspection - Left - No Cyanotic  nailbeds, Not Ischemic. Right - No Cyanotic nailbeds, Not Ischemic.  Neurologic Neurologic evaluation reveals -normal attention span and ability to concentrate, able to name objects and repeat phrases. Appropriate fund of knowledge , normal sensation and normal coordination. Mental Status Affect - not angry, not paranoid. Cranial Nerves-Normal Bilaterally. Gait-Normal.  Neuropsychiatric Mental status exam performed with findings of-able to articulate well with normal speech/language, rate, volume and coherence, thought content normal with ability to perform basic computations and apply abstract reasoning and no evidence of hallucinations, delusions, obsessions or homicidal/suicidal ideation.  Musculoskeletal Global Assessment Spine, Ribs and Pelvis - no instability, subluxation or laxity. Right Upper Extremity - no instability, subluxation or laxity.  Lymphatic Head & Neck  General Head & Neck Lymphatics: Bilateral - Description - No Localized lymphadenopathy. Axillary  General Axillary Region: Bilateral - Description - No Localized lymphadenopathy. Femoral & Inguinal  Generalized Femoral & Inguinal Lymphatics: Left - Description - No Localized lymphadenopathy. Right - Description - No Localized lymphadenopathy.    Assessment & Plan Donna Sportsman MD; 12/28/2015 9:56 AM)  CHRONIC CHOLECYSTITIS WITH CALCULUS (K80.10) Impression: History physical and ultrasound strongly suspicious for biliary colic. Rest of the differential diagnosis seems unlikely. I think she would benefit from cholecystectomy. They're wanting this done as soon as possible.  I recommend she stick to soups and liquids diet for the next 48 hours. Continue the Zofran. See if things will continue to calm down. She is much better now than she was in the ER yesterday. Her affect if she has unbearable symptoms, may need to be admitted for urgent cholecystectomy. However census is high right now. We'll try and calm  this down and do this soon electively.  Current Plans You are being scheduled for surgery - Our schedulers will call you.  You should hear from our office's scheduling department within 5 working days about the location, date, and time of surgery. We try to make accommodations for patient's preferences in scheduling surgery, but sometimes the OR schedule or the surgeon's schedule prevents [place] from making those accommodations.  If you have not heard from our office [phone]) in 5 working days, call the office and ask for your surgeon's nurse.  If you have other questions about your diagnosis, plan, or surgery, call the office and ask for your surgeon's nurse.  The anatomy & physiology of hepatobiliary & pancreatic function was discussed. The pathophysiology of gallbladder dysfunction was  discussed. Natural history risks without surgery was discussed. I feel the risks of no intervention will lead to serious problems that outweigh the operative risks; therefore, I recommended cholecystectomy to remove the pathology. I explained laparoscopic techniques with possible need for an open approach. Probable cholangiogram to evaluate the bilary tract was explained as well.  Risks such as bleeding, infection, abscess, leak, injury to other organs, need for further treatment, heart attack, death, and other risks were discussed. I noted a good likelihood this will help address the problem. Possibility that this will not correct all abdominal symptoms was explained. Goals of post-operative recovery were discussed as well. We will work to minimize complications. An educational handout further explaining the pathology and treatment options was given as well. Questions were answered. The patient expresses understanding & wishes to proceed with surgery.  Pt Education - Pamphlet Given - Laparoscopic Gallbladder Surgery: discussed with patient and provided information. Written instructions provided Pt Education -  [facility] Laparosopic Post Op HCI (Janisse Ghan) Pt Education - [facility] Good Bowel Health (Alysson Geist) Pt Education - Laparoscopic Cholecystectomy: gallbladder  Donna Freeman, M.D., F.A.C.S. Gastrointestinal and Minimally Invasive Surgery [facility] [address], [address] [place], [place] [address] [phone] Main / Paging

## 2015-12-30 NOTE — Discharge Instructions (Signed)
LAPAROSCOPIC SURGERY: POST OP INSTRUCTIONS ° °1. DIET: Follow a light bland diet the first 24 hours after arrival home, such as soup, liquids, crackers, etc.  Be sure to include lots of fluids daily.  Avoid fast food or heavy meals as your are more likely to get nauseated.  Eat a low fat the next few days after surgery.   °2. Take your usually prescribed home medications unless otherwise directed. °3. PAIN CONTROL: °a. Pain is best controlled by a usual combination of three different methods TOGETHER: °i. Ice/Heat °ii. Over the counter pain medication °iii. Prescription pain medication °b. Most patients will experience some swelling and bruising around the incisions.  Ice packs or heating pads (30-60 minutes up to 6 times a day) will help. Use ice for the first few days to help decrease swelling and bruising, then switch to heat to help relax tight/sore spots and speed recovery.  Some people prefer to use ice alone, heat alone, alternating between ice & heat.  Experiment to what works for you.  Swelling and bruising can take several weeks to resolve.   °c. It is helpful to take an over-the-counter pain medication regularly for the first few weeks.  Choose one of the following that works best for you: °i. Naproxen (Aleve, etc)  Two 220mg tabs twice a day °ii. Ibuprofen (Advil, etc) Three 200mg tabs four times a day (every meal & bedtime) °iii. Acetaminophen (Tylenol, etc) 500-650mg four times a day (every meal & bedtime) °d. A  prescription for pain medication (such as oxycodone, hydrocodone, etc) should be given to you upon discharge.  Take your pain medication as prescribed.  °i. If you are having problems/concerns with the prescription medicine (does not control pain, nausea, vomiting, rash, itching, etc), please call us [phone] to see if we need to switch you to a different pain medicine that will work better for you and/or control your side effect better. °ii. If you need a refill on your pain medication,  please contact your pharmacy.  They will contact our office to request authorization. Prescriptions will not be filled after 5 pm or on week-ends. °4. Avoid getting constipated.  Between the surgery and the pain medications, it is common to experience some constipation.  Increasing fluid intake and taking a fiber supplement (such as Metamucil, Citrucel, FiberCon, MiraLax, etc) 1-2 times a day regularly will usually help prevent this problem from occurring.  A mild laxative (prune juice, Milk of Magnesia, MiraLax, etc) should be taken according to package directions if there are no bowel movements after 48 hours.   °5. Watch out for diarrhea.  If you have many loose bowel movements, simplify your diet to bland foods & liquids for a few days.  Stop any stool softeners and decrease your fiber supplement.  Switching to mild anti-diarrheal medications (Kayopectate, Pepto Bismol) can help.  If this worsens or does not improve, please call us. °6. Wash / shower every day.  You may shower over the dressings as they are waterproof.  Continue to shower over incision(s) after the dressing is off. °7. Remove your waterproof bandages 5 days after surgery.  You may leave the incision open to air.  You may replace a dressing/Band-Aid to cover the incision for comfort if you wish.  °8. ACTIVITIES as tolerated:   °a. You may resume regular (light) daily activities beginning the next day--such as daily self-care, walking, climbing stairs--gradually increasing activities as tolerated.  If you can walk 30 minutes without difficulty, it   is safe to try more intense activity such as jogging, treadmill, bicycling, low-impact aerobics, swimming, etc. b. Save the most intensive and strenuous activity for last such as sit-ups, heavy lifting, contact sports, etc  Refrain from any heavy lifting or straining until you are off narcotics for pain control.   c. DO NOT PUSH THROUGH PAIN.  Let pain be your guide: If it hurts to do something, don't  do it.  Pain is your body warning you to avoid that activity for another week until the pain goes down. d. You may drive when you are no longer taking prescription pain medication, you can comfortably wear a seatbelt, and you can safely maneuver your car and apply brakes. e. Dennis Bast may have sexual intercourse when it is comfortable.  9. FOLLOW UP in our office a. Please call [facility] at [phone] to set up an appointment to see your surgeon in the office for a follow-up appointment approximately 2-3 weeks after your surgery. b. Make sure that you call for this appointment the day you arrive home to insure a convenient appointment time. 10. IF YOU HAVE DISABILITY OR FAMILY LEAVE FORMS, BRING THEM TO THE OFFICE FOR PROCESSING.  DO NOT GIVE THEM TO YOUR DOCTOR.   WHEN TO CALL us [phone]: 1. Poor pain control 2. Reactions / problems with new medications (rash/itching, nausea, etc)  3. Fever over 101.5 F (38.5 C) 4. Inability to urinate 5. Nausea and/or vomiting 6. Worsening swelling or bruising 7. Continued bleeding from incision. 8. Increased pain, redness, or drainage from the incision   The clinic staff is available to answer your questions during regular business hours (8:30am-5pm).  Please dont hesitate to call and ask to speak to one of our nurses for clinical concerns.   If you have a medical emergency, go to the nearest emergency room or call 911.  A surgeon from [facility] is always on call at the [facility], [place], [place], [place], [place]  [address] ? MAIN: [phone] ? TOLL FREE: [phone] ?  FAX [phone] [email or web address]   Cholecystitis Cholecystitis is inflammation of the gallbladder. It is often called a gallbladder attack. The gallbladder is a pear-shaped organ that lies beneath the liver on the right side of the body. The gallbladder stores bile, which is a fluid that helps the  body to digest fats. If bile builds up in your gallbladder, your gallbladder becomes inflamed. This condition may occur suddenly (be acute). Repeat episodes of acute cholecystitis or prolonged episodes may lead to a long-term (chronic) condition. Cholecystitis is serious and it requires treatment.  CAUSES The most common cause of this condition is gallstones. Gallstones can block the tube (duct) that carries bile out of your gallbladder. This causes bile to build up. Other causes of this condition include:  Damage to the gallbladder due to a decrease in blood flow.  Infections in the bile ducts.  Scars or kinks in the bile ducts.  Tumors in the liver, pancreas, or gallbladder. RISK FACTORS This condition is more likely to develop in:  People who have sickle cell disease.  People who take birth control pills or use estrogen.  People who have alcoholic liver disease.  People who have liver cirrhosis.  People who have their nutrition delivered through a vein (parenteral nutrition).  People who do not eat or drink (do fasting) for a long period of time.  People who are obese.  People who  have rapid weight loss.  People who are pregnant.  People who have increased triglyceride levels.  People who have pancreatitis. SYMPTOMS Symptoms of this condition include:  Abdominal pain, especially in the upper right area of the abdomen.  Abdominal tenderness or bloating.  Nausea.  Vomiting.  Fever.  Chills.  Yellowing of the skin and the whites of the eyes (jaundice). DIAGNOSIS This condition is diagnosed with a medical history and physical exam. You may also have other tests, including:  Imaging tests, such as:  An ultrasound of the gallbladder.  A CT scan of the abdomen.  A gallbladder nuclear scan (HIDA scan). This scan allows your health care provider to see the bile moving from your liver to your gallbladder and to your small intestine.  MRI.  Blood tests, such  as:  A complete blood count, because the white blood cell count may be higher than normal.  Liver function tests, because some levels may be higher than normal with certain types of gallstones. TREATMENT Treatment may include:  Fasting for a certain amount of time.  IV fluids.  Medicine to treat pain or vomiting.  Antibiotic medicine.  Surgery to remove your gallbladder (cholecystectomy). This may happen immediately or at a later time. Datto care will depend on your treatment. In general:  Take over-the-counter and prescription medicines only as told by your health care provider.  If you were prescribed an antibiotic medicine, take it as told by your health care provider. Do not stop taking the antibiotic even if you start to feel better.  Follow instructions from your health care provider about what to eat or drink. When you are allowed to eat, avoid eating or drinking anything that triggers your symptoms.  Keep all follow-up visits as told by your health care provider. This is important. SEEK MEDICAL CARE IF:  Your pain is not controlled with medicine.  You have a fever. SEEK IMMEDIATE MEDICAL CARE IF:  Your pain moves to another part of your abdomen or to your back.  You continue to have symptoms or you develop new symptoms even with treatment.   This information is not intended to replace advice given to you by your health care provider. Make sure you discuss any questions you have with your health care provider.   Document Released: 11/06/2005 Document Revised: 07/28/2015 Document Reviewed: 02/17/2015 Elsevier Interactive Patient Education 2016 [place]. Irregular bowel habits such as constipation and diarrhea can lead to many problems over time.  Having one soft bowel movement a day is the most important way to prevent further problems.  The anorectal canal is designed to handle stretching and feces to safely  manage our ability to get rid of solid waste (feces, poop, stool) out of our body.  BUT, hard constipated stools can act like ripping concrete bricks and diarrhea can be a burning fire to this very sensitive area of our body, causing inflamed hemorrhoids, anal fissures, increasing risk is perirectal abscesses, abdominal pain/bloating, an making irritable bowel worse.      The goal: ONE SOFT BOWEL MOVEMENT A DAY!  To have soft, regular bowel movements:   Drink plenty of fluids, consider 4-6 tall glasses of water a day.    Take plenty of fiber.  Fiber is the undigested part of plant food that passes into the colon, acting s natures broom to encourage bowel motility and movement.  Fiber can absorb and hold large amounts of water. This results  in a larger, bulkier stool, which is soft and easier to pass. Work gradually over several weeks up to 6 servings a day of fiber (25g a day even more if needed) in the form of: o Vegetables -- Root (potatoes, carrots, turnips), leafy green (lettuce, salad greens, celery, spinach), or cooked high residue (cabbage, broccoli, etc) o Fruit -- Fresh (unpeeled skin & pulp), Dried (prunes, apricots, cherries, etc ),  or stewed ( applesauce)  o Whole grain breads, pasta, etc (whole wheat)  o Bran cereals   Bulking Agents -- This type of water-retaining fiber generally is easily obtained each day by one of the following:  o Psyllium bran -- The psyllium plant is remarkable because its ground seeds can retain so much water. This product is available as Metamucil, Konsyl, Effersyllium, Per Diem Fiber, or the less expensive generic preparation in drug and health food stores. Although labeled a laxative, it really is not a laxative.  o Methylcellulose -- This is another fiber derived from wood which also retains water. It is available as Citrucel. o Polyethylene Glycol - and artificial fiber commonly called Miralax or Glycolax.  It is helpful for people with gassy or bloated  feelings with regular fiber o Flax Seed - a less gassy fiber than psyllium  No reading or other relaxing activity while on the toilet. If bowel movements take longer than 5 minutes, you are too constipated  AVOID CONSTIPATION.  High fiber and water intake usually takes care of this.  Sometimes a laxative is needed to stimulate more frequent bowel movements, but   Laxatives are not a good long-term solution as it can wear the colon out.  They can help jump-start bowels if constipated, but should be relied on constantly without discussing with your doctor o Osmotics (Milk of Magnesia, Fleets phosphosoda, Magnesium citrate, MiraLax, GoLytely) are safer than  o Stimulants (Senokot, Castor Oil, Dulcolax, Ex Lax)    o Avoid taking laxatives for more than 7 days in a row.   IF SEVERELY CONSTIPATED, try a Bowel Retraining Program: o Do not use laxatives.  o Eat a diet high in roughage, such as bran cereals and leafy vegetables.  o Drink six (6) ounces of prune or apricot juice each morning.  o Eat two (2) large servings of stewed fruit each day.  o Take one (1) heaping tablespoon of a psyllium-based bulking agent twice a day. Use sugar-free sweetener when possible to avoid excessive calories.  o Eat a normal breakfast.  o Set aside 15 minutes after breakfast to sit on the toilet, but do not strain to have a bowel movement.  o If you do not have a bowel movement by the third day, use an enema and repeat the above steps.   Controlling diarrhea o Switch to liquids and simpler foods for a few days to avoid stressing your intestines further. o Avoid dairy products (especially milk & ice cream) for a short time.  The intestines often can lose the ability to digest lactose when stressed. o Avoid foods that cause gassiness or bloating.  Typical foods include beans and other legumes, cabbage, broccoli, and dairy foods.  Every person has some sensitivity to other foods, so listen to our body and avoid those  foods that trigger problems for you. o Adding fiber (Citrucel, Metamucil, psyllium, Miralax) gradually can help thicken stools by absorbing excess fluid and retrain the intestines to act more normally.  Slowly increase the dose over a few weeks.  Too much fiber  too soon can backfire and cause cramping & bloating. °o Probiotics (such as active yogurt, Align, etc) may help repopulate the intestines and colon with normal bacteria and calm down a sensitive digestive tract.  Most studies show it to be of mild help, though, and such products can be costly. °o Medicines: °- Bismuth subsalicylate (ex. Kayopectate, Pepto Bismol) every 30 minutes for up to 6 doses can help control diarrhea.  Avoid if pregnant. °- Loperamide (Immodium) can slow down diarrhea.  Start with two tablets (4mg total) first and then try one tablet every 6 hours.  Avoid if you are having fevers or severe pain.  If you are not better or start feeling worse, stop all medicines and call your doctor for advice °o Call your doctor if you are getting worse or not better.  Sometimes further testing (cultures, endoscopy, X-ray studies, bloodwork, etc) may be needed to help diagnose and treat the cause of the diarrhea. ° °TROUBLESHOOTING IRREGULAR BOWELS °1) Avoid extremes of bowel movements (no bad constipation/diarrhea) °2) Miralax 17gm mixed in 8oz. water or juice-daily. May use BID as needed.  °3) Gas-x,Phazyme, etc. as needed for gas & bloating.  °4) Soft,bland diet. No spicy,greasy,fried foods.  °5) Prilosec over-the-counter as needed  °6) May hold gluten/wheat products from diet to see if symptoms improve.  °7)  May try probiotics (Align, Activa, etc) to help calm the bowels down °7) If symptoms become worse call back immediately. ° °Managing Pain ° °Pain after surgery or related to activity is often due to strain/injury to muscle, tendon, nerves and/or incisions.  This pain is usually short-term and will improve in a few months.  ° °Many people find  it helpful to do the following things TOGETHER to help speed the process of healing and to get back to regular activity more quickly: ° °1. Avoid heavy physical activity at first °a. No lifting greater than 20 pounds at first, then increase to lifting as tolerated over the next few weeks °b. Do not “push through” the pain.  Listen to your body and avoid positions and maneuvers than reproduce the pain.  Wait a few days before trying something more intense °c. Walking is okay as tolerated, but go slowly and stop when getting sore.  If you can walk 30 minutes without stopping or pain, you can try more intense activity (running, jogging, aerobics, cycling, swimming, treadmill, sex, sports, weightlifting, etc ) °d. Remember: If it hurts to do it, then don’t do it! ° °2. Take Anti-inflammatory medication °a. Choose ONE of the following over-the-counter medications: °i.            Acetaminophen 500mg tabs (Tylenol) 1-2 pills with every meal and just before bedtime (avoid if you have liver problems) °ii.            Naproxen 220mg tabs (ex. Aleve) 1-2 pills twice a day (avoid if you have kidney, stomach, IBD, or bleeding problems) °iii. Ibuprofen 200mg tabs (ex. Advil, Motrin) 3-4 pills with every meal and just before bedtime (avoid if you have kidney, stomach, IBD, or bleeding problems) °b. Take with food/snack around the clock for 1-2 weeks °i. This helps the muscle and nerve tissues become less irritable and calm down faster ° °3. Use a Heating pad or Ice/Cold Pack °a. 4-6 times a day °b. May use warm bath/hottub  or showers ° °4. Try Gentle Massage and/or Stretching  °a. at the area of pain many times a day °b. stop if you feel pain -   do not overdo it ° °Try these steps together to help you body heal faster and avoid making things get worse.  Doing just one of these things may not be enough.   ° °If you are not getting better after two weeks or are noticing you are getting worse, contact our office for further advice; we  may need to re-evaluate you & see what other things we can do to help. ° ° °General Anesthesia, Adult, Care After °Refer to this sheet in the next few weeks. These instructions provide you with information on caring for yourself after your procedure. Your health care provider may also give you more specific instructions. Your treatment has been planned according to current medical practices, but problems sometimes occur. Call your health care provider if you have any problems or questions after your procedure. °WHAT TO EXPECT AFTER THE PROCEDURE °After the procedure, it is typical to experience: °· Sleepiness. °· Nausea and vomiting. °HOME CARE INSTRUCTIONS °· For the first 24 hours after general anesthesia: °¨ Have a responsible person with you. °¨ Do not drive a car. If you are alone, do not take public transportation. °¨ Do not drink alcohol. °¨ Do not take medicine that has not been prescribed by your health care provider. °¨ Do not sign important papers or make important decisions. °¨ You may resume a normal diet and activities as directed by your health care provider. °· Change bandages (dressings) as directed. °· If you have questions or problems that seem related to general anesthesia, call the hospital and ask for the anesthetist or anesthesiologist on call. °SEEK MEDICAL CARE IF: °· You have nausea and vomiting that continue the day after anesthesia. °· You develop a rash. °SEEK IMMEDIATE MEDICAL CARE IF:  °· You have difficulty breathing. °· You have chest pain. °· You have any allergic problems. °  °This information is not intended to replace advice given to you by your health care provider. Make sure you discuss any questions you have with your health care provider. °  °Document Released: 02/12/2001 Document Revised: 11/27/2014 Document Reviewed: 03/06/2012 °Elsevier Interactive Patient Education ©2016 [employer] Inc[employer] ° °

## 2015-12-30 NOTE — Anesthesia Preprocedure Evaluation (Addendum)
Anesthesia Evaluation  Patient identified by MRN, date of birth, ID band Patient awake    Reviewed: Allergy & Precautions, NPO status , Patient's Chart, lab work & pertinent test results  History of Anesthesia Complications (+) PONV  Airway Mallampati: III  TM Distance: >3 FB Neck ROM: Full    Dental  (+) Dental Advisory Given   Pulmonary Current Smoker,    breath sounds clear to auscultation       Cardiovascular hypertension, Pt. on medications  Rhythm:Regular Rate:Normal     Neuro/Psych Anxiety negative neurological ROS     GI/Hepatic Neg liver ROS, GERD  ,  Endo/Other  negative endocrine ROSdiabetes  Renal/GU negative Renal ROS     Musculoskeletal   Abdominal   Peds  Hematology ?[place] deficiency. Never received any treatment in the past.    Anesthesia Other Findings   Reproductive/Obstetrics                            Lab Results  Component Value Date   WBC 10.1 12/27/2015   HGB 16.1* 12/27/2015   HCT 48.6* 12/27/2015   MCV 85.4 12/27/2015   PLT 219 12/27/2015   Lab Results  Component Value Date   CREATININE 0.96 12/27/2015   BUN 7 12/27/2015   NA 141 12/27/2015   K 3.2* 12/27/2015   CL 102 12/27/2015   CO2 24 12/27/2015    Anesthesia Physical Anesthesia Plan  ASA: II  Anesthesia Plan: General   Post-op Pain Management:    Induction: Intravenous  Airway Management Planned: Oral ETT  Additional Equipment:   Intra-op Plan:   Post-operative Plan: Extubation in OR  Informed Consent: I have reviewed the patients History and Physical, chart, labs and discussed the procedure including the risks, benefits and alternatives for the proposed anesthesia with the patient or authorized representative who has indicated his/her understanding and acceptance.   Dental advisory given  Plan Discussed with: CRNA  Anesthesia Plan Comments:         Anesthesia  Quick Evaluation

## 2015-12-30 NOTE — Op Note (Signed)
12/30/2015  2:05 PM  PATIENT:  Donna Freeman  45 y.o. female  Patient Care Team: Gaspar Garbe, MD as PCP - General (Internal Medicine) Karie Soda, MD as Consulting Physician (General Surgery)  PRE-OPERATIVE DIAGNOSIS:  Symptomatic biliary colic probable chronic cholecystitis  POST-OPERATIVE DIAGNOSIS:  Acute on chronic cholecystitis  PROCEDURE:  Procedure(s): LAPAROSCOPIC CHOLECYSTECTOMY SINGLE SITE WITH INTRAOPERATIVE CHOLANGIOGRAM  SURGEON:  Surgeon(s): Karie Soda, MD  ASSISTANT: RN   ANESTHESIA:   local and general  EBL:     Delay start of Pharmacological VTE agent (>24hrs) due to surgical blood loss or risk of bleeding:  no  DRAINS: none   SPECIMEN:  Source of Specimen:  Gallbladder   DISPOSITION OF SPECIMEN:  PATHOLOGY  COUNTS:  YES  PLAN OF CARE: Discharge to home after PACU  PATIENT DISPOSITION:  PACU - hemodynamically stable.  INDICATION: Patient with biliary colic with persistent pain suspicious for acute on chronic cholecystitis.  Rest of the differential diagnosis seems unlikely.  I recommended cholecystectomy  The anatomy & physiology of hepatobiliary & pancreatic function was discussed.  The pathophysiology of gallbladder dysfunction was discussed.  Natural history risks without surgery was discussed.   I feel the risks of no intervention will lead to serious problems that outweigh the operative risks; therefore, I recommended cholecystectomy to remove the pathology.  I explained laparoscopic techniques with possible need for an open approach.  Probable cholangiogram to evaluate the bilary tract was explained as well.    Risks such as bleeding, infection, abscess, leak, injury to other organs, need for further treatment, heart attack, death, and other risks were discussed.  I noted a good likelihood this will help address the problem.  Possibility that this will not correct all abdominal symptoms was explained.  Goals of post-operative recovery were  discussed as well.  We will work to minimize complications.  An educational handout further explaining the pathology and treatment options was given as well.  Questions were answered.  The patient expresses understanding & wishes to proceed with surgery.   OR FINDINGS: Turgid thickened gallbladder with inflammation consistent with acute cholecystitis.  Cholangiogram with classic biliary anatomy.  Somewhat low cystic duct insertion.  No leak.  No defects.  No stricture.  No fatty change of the liver.  No adhesions.  DESCRIPTION:   The patient was identified & brought in the operating room. The patient was positioned supine with arms tucked. SCDs were active during the entire case. The patient underwent general anesthesia without any difficulty.  The abdomen was prepped and draped in a sterile fashion. A Surgical Timeout confirmed our plan.  I made a transverse curvilinear incision through the superior umbilical fold.  I placed a 5mm long port through the supraumbilical fascia using a modified Hassan cutdown technique. I began carbon dioxide insufflation. Camera inspection revealed no injury. There were no adhesions to the anterior abdominal wall supraumbilically.  I proceeded to continue with single site technique. I placed a #5 port in left upper aspect of the wound. I placed a 5 mm atraumatic grasper in the right inferior aspect of the wound.  I turned attention to the right upper quadrant.  gallbladder was swollen and inflamed consistent with acute cholecystitis.  She received IV cefazolin and metronidazole.  Freed adhesions off the liver and gallbladder greater omentum to help mobilize gallbladder.  The gallbladder fundus was elevated cephalad.   The gallbladder wall was somewhat ischemic.  I did an upper evacuating bile out of the gallbladder for  better decompression.   2 L irrigation done.  I freed the peritoneal coverings between the gallbladder and the liver on the posteriolateral and  anteriomedial walls. I alternated between Harmonic & blunt Maryland dissection to help get a good critical view of the cystic artery and cystic duct. I did further dissection to free a few centimeters of the  gallbladder off the liver bed to get a good critical view of the infundibulum and cystic duct. I mobilized the cystic artery; and, after getting a good 360 view, ligated the cystic artery using the Harmonic ultrasonic dissection. I skeletonized the cystic duct.  I placed a clip on the infundibulum. I did a partial cystic duct-otomy and ensured patency. I placed a 5 [place] cholangiocatheter through a puncture site at the right subcostal ridge of the abdominal wall and directed it into the cystic duct.  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  Her did a second bite ductotomy 6 mm more proximally.  With that I could pass the cholangiocatheter into the cystic duct   We ran a cholangiogram with dilute radio-opaque contrast and continuous fluoroscopy. Contrast flowed from a side branch consistent with cystic duct cannulization. Contrast flowed up the common hepatic duct into the right and left intrahepatic chains out to secondary radicals. Contrast flowed down the common bile duct easily across the normal ampulla into the duodenum.  This was consistent with a normal cholangiogram.  I removed the cholangiocatheter. I placed clips on the cystic duct x4.  I completed cystic duct transection. I freed the gallbladder from its remaining attachments to the liver. I ensured hemostasis on the gallbladder fossa of the liver and elsewhere. did have to wedge off a tongue of liver as the gallbladder was quite intrahepatic and the anterior liver surface was thinned out over it.   I inspected the rest of the abdomen & detected no injury nor bleeding elsewhere.  I removed the gallbladder out the supraumbilical fascia inside an Endo Catch bag.   I  did have to dilate the defect to 2 cm to get the bag with a thickened gallbladder wall out.  . I closed the fascia transversely using #1 PDS interrupted stitches given the inflammation, increased size, and her obesity.  I closed the skin using 4-0 monocryl stitch.  Sterile dressing was applied. The patient was extubated & arrived in the PACU in stable condition..  I had discussed postoperative care with the patient in the holding area.  I am about to locate the patient's mother and discuss operative findings and postoperative goals / instructions.  Instructions are written in the chart as well.  Ardeth Sportsman, M.D., F.A.C.S. Gastrointestinal and Minimally Invasive Surgery [facility] [address], [address] [place], [place] [address] [phone] Main / Paging

## 2015-12-30 NOTE — Interval H&P Note (Signed)
History and Physical Interval Note:  12/30/2015 11:19 AM  Donna Freeman  has presented today for surgery, with the diagnosis of Symptomatic biliary colic probable chronic cholecystitis  The various methods of treatment have been discussed with the patient and family. After consideration of risks, benefits and other options for treatment, the patient has consented to  Procedure(s): LAPAROSCOPIC CHOLECYSTECTOMY SINGLE SITE WITH INTRAOPERATIVE CHOLANGIOGRAM (N/A) as a surgical intervention .  The patient's history has been reviewed, patient examined, no change in status, stable for surgery.  I have reviewed the patient's chart and labs.  Questions were answered to the patient's satisfaction.     [name]

## 2015-12-30 NOTE — Anesthesia Postprocedure Evaluation (Signed)
Anesthesia Post Note  Patient: Donna Freeman  Procedure(s) Performed: Procedure(s) (LRB): LAPAROSCOPIC CHOLECYSTECTOMY SINGLE SITE WITH INTRAOPERATIVE CHOLANGIOGRAM (N/A)  Patient location during evaluation: PACU Anesthesia Type: General Level of consciousness: awake and alert Pain management: pain level controlled Vital Signs Assessment: post-procedure vital signs reviewed and stable Respiratory status: spontaneous breathing Cardiovascular status: blood pressure returned to baseline Anesthetic complications: no    Last Vitals:  Filed Vitals:   12/30/15 1515 12/30/15 1608  BP: 133/58 144/69  Pulse: 73 83  Temp: 36.7 C 36.7 C  Resp: 16 16    Last Pain:  Filed Vitals:   12/30/15 1610  PainSc: 0-No pain                 Kennieth Rad

## 2015-12-30 NOTE — Anesthesia Procedure Notes (Signed)
Procedure Name: Intubation Date/Time: 12/30/2015 12:21 PM Performed by: Paris Lore Pre-anesthesia Checklist: Patient identified, Emergency Drugs available, Suction available, Patient being monitored and Timeout performed Patient Re-evaluated:Patient Re-evaluated prior to inductionOxygen Delivery Method: Circle system utilized Preoxygenation: Pre-oxygenation with 100% oxygen Intubation Type: IV induction Ventilation: Mask ventilation without difficulty Laryngoscope Size: Mac and 4 Grade View: Grade II Tube type: Oral Tube size: 7.5 mm Number of attempts: 1 Airway Equipment and Method: Stylet Placement Confirmation: ETT inserted through vocal cords under direct vision,  positive ETCO2,  CO2 detector and breath sounds checked- equal and bilateral Secured at: 21 cm Tube secured with: Tape Dental Injury: Teeth and Oropharynx as per pre-operative assessment

## 2017-03-19 IMAGING — CR DG ABDOMEN ACUTE W/ 1V CHEST
4 series · 4 of 4 positions shown · non-contrast
Comparison: Chest radiograph dated 01/28/2010

CLINICAL DATA: 44-year-old female with epigastric pain

EXAM:
DG ABDOMEN ACUTE W/ 1V CHEST

[chest pa]
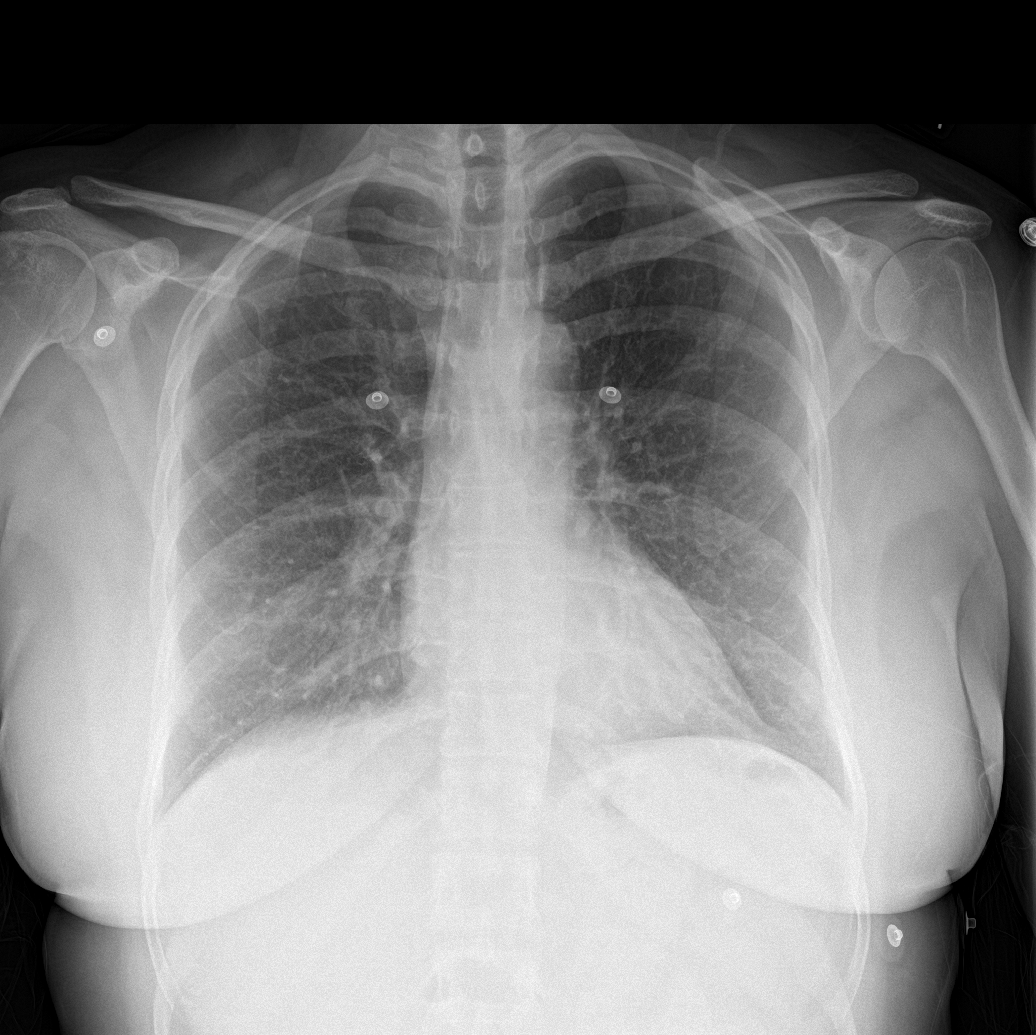

[abdomen erect]
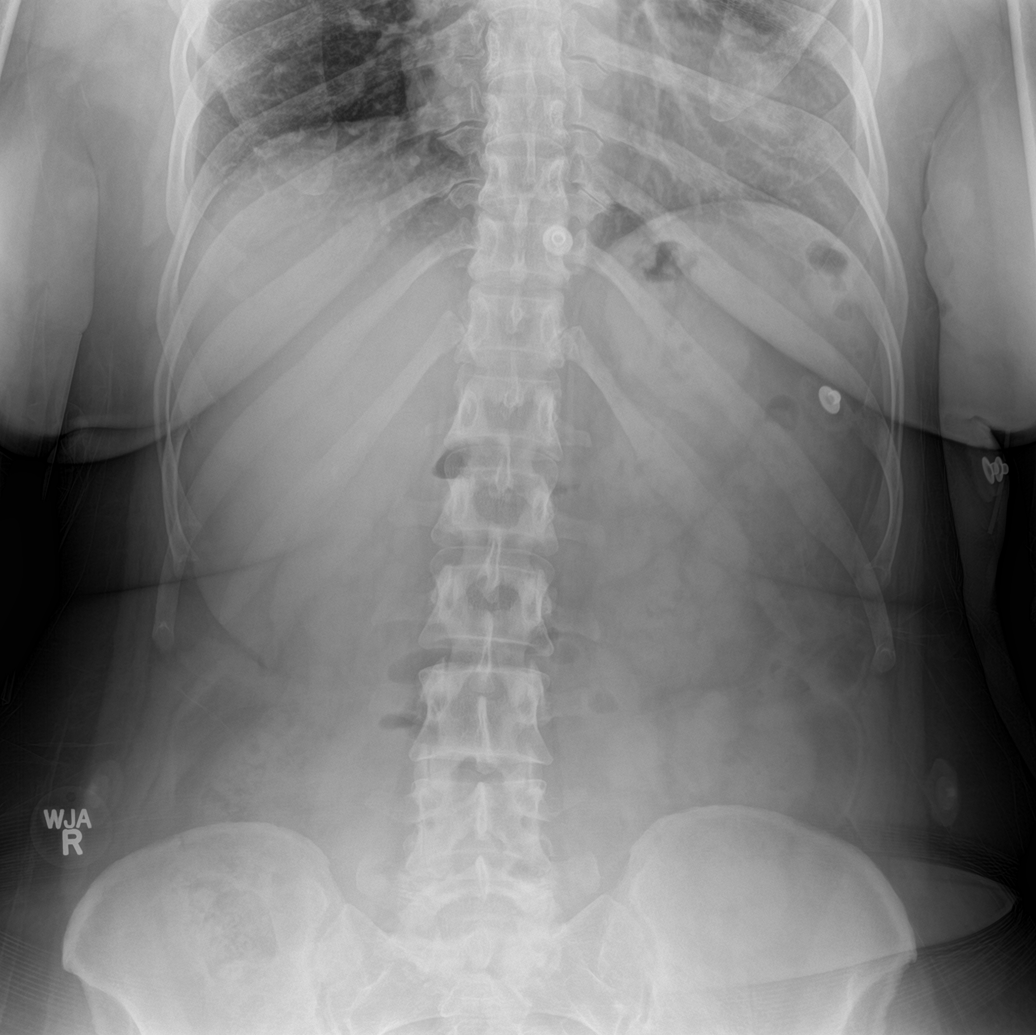

[abdomen supine (1 of 2)]
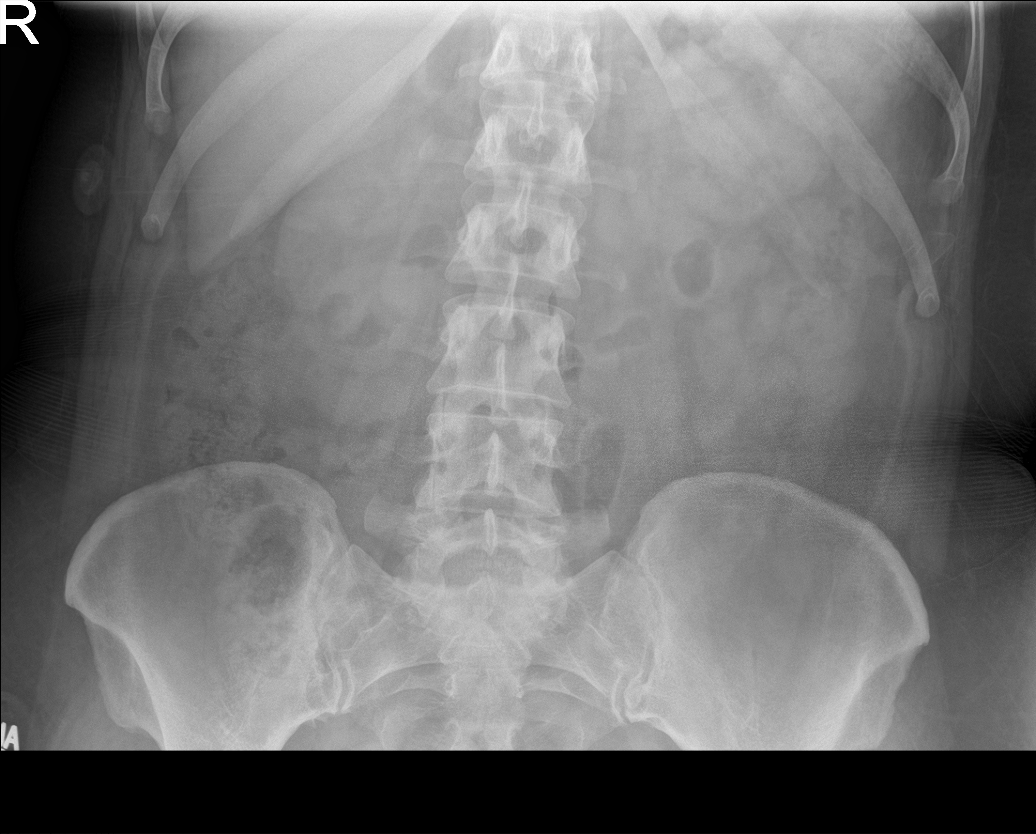

[abdomen supine (2 of 2)]
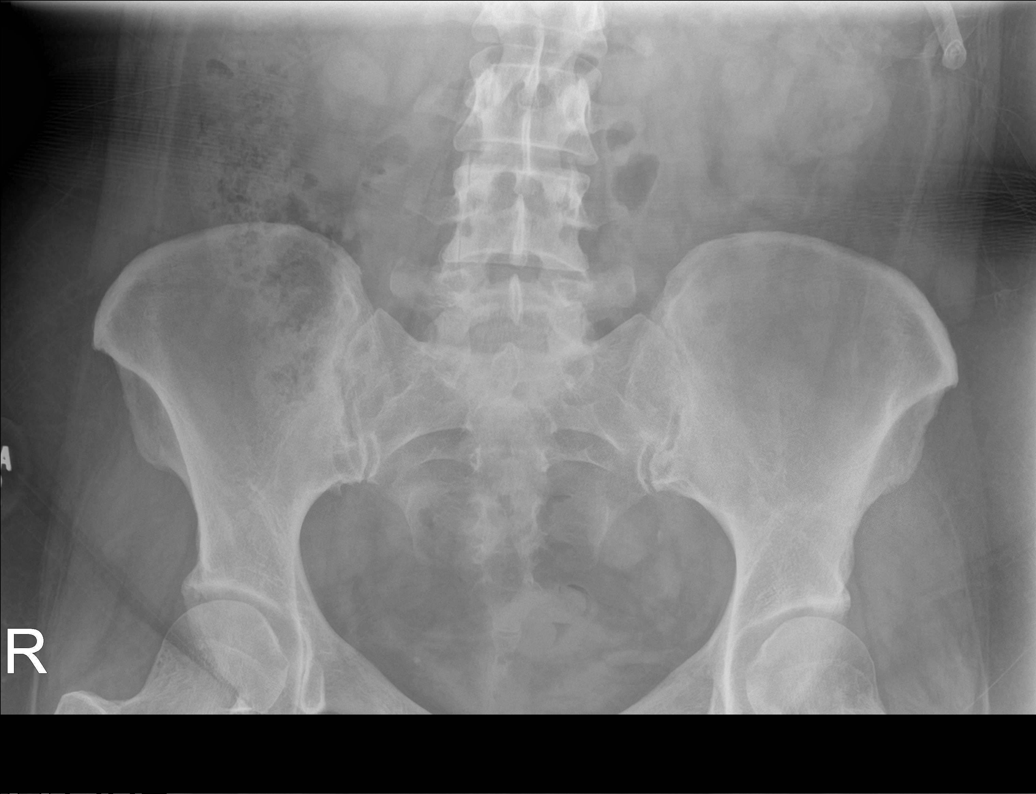

[4 of 4 positions shown; findings below may reference images not displayed]

FINDINGS: Moderate stool throughout the colon. There is no evidence of dilated
bowel loops or free intraperitoneal air. No radiopaque calculi or
other significant radiographic abnormality is seen. Heart size and
mediastinal contours are within normal limits. Both lungs are clear.
IMPRESSION: Negative abdominal radiographs.  No acute cardiopulmonary disease.

## 2018-07-25 DIAGNOSIS — Z0000 Encounter for general adult medical examination without abnormal findings: Secondary | ICD-10-CM | POA: Diagnosis not present

## 2018-07-25 DIAGNOSIS — R82998 Other abnormal findings in urine: Secondary | ICD-10-CM | POA: Diagnosis not present

## 2018-07-31 DIAGNOSIS — E7849 Other hyperlipidemia: Secondary | ICD-10-CM | POA: Diagnosis not present

## 2018-07-31 DIAGNOSIS — F17200 Nicotine dependence, unspecified, uncomplicated: Secondary | ICD-10-CM | POA: Diagnosis not present

## 2018-07-31 DIAGNOSIS — I10 Essential (primary) hypertension: Secondary | ICD-10-CM | POA: Diagnosis not present

## 2018-07-31 DIAGNOSIS — Z23 Encounter for immunization: Secondary | ICD-10-CM | POA: Diagnosis not present

## 2018-07-31 DIAGNOSIS — Z0000 Encounter for general adult medical examination without abnormal findings: Secondary | ICD-10-CM | POA: Diagnosis not present

## 2018-07-31 DIAGNOSIS — Z1389 Encounter for screening for other disorder: Secondary | ICD-10-CM | POA: Diagnosis not present

## 2018-09-12 DIAGNOSIS — Z01419 Encounter for gynecological examination (general) (routine) without abnormal findings: Secondary | ICD-10-CM | POA: Diagnosis not present

## 2018-09-12 DIAGNOSIS — Z6832 Body mass index (BMI) 32.0-32.9, adult: Secondary | ICD-10-CM | POA: Diagnosis not present

## 2018-09-12 DIAGNOSIS — Z1231 Encounter for screening mammogram for malignant neoplasm of breast: Secondary | ICD-10-CM | POA: Diagnosis not present

## 2018-09-23 DIAGNOSIS — Z30017 Encounter for initial prescription of implantable subdermal contraceptive: Secondary | ICD-10-CM | POA: Diagnosis not present

## 2018-09-23 DIAGNOSIS — Z3046 Encounter for surveillance of implantable subdermal contraceptive: Secondary | ICD-10-CM | POA: Diagnosis not present

## 2019-06-17 ENCOUNTER — Other Ambulatory Visit: Payer: Self-pay

## 2019-06-17 ENCOUNTER — Ambulatory Visit (INDEPENDENT_AMBULATORY_CARE_PROVIDER_SITE_OTHER): Payer: 59 | Admitting: Neurology

## 2019-06-17 ENCOUNTER — Encounter: Payer: Self-pay | Admitting: Neurology

## 2019-06-17 VITALS — BP 133/84 | HR 84 | Ht 67.0 in | Wt 213.0 lb

## 2019-06-17 DIAGNOSIS — R351 Nocturia: Secondary | ICD-10-CM

## 2019-06-17 DIAGNOSIS — E669 Obesity, unspecified: Secondary | ICD-10-CM

## 2019-06-17 DIAGNOSIS — R519 Headache, unspecified: Secondary | ICD-10-CM

## 2019-06-17 DIAGNOSIS — F17200 Nicotine dependence, unspecified, uncomplicated: Secondary | ICD-10-CM

## 2019-06-17 DIAGNOSIS — R0683 Snoring: Secondary | ICD-10-CM | POA: Diagnosis not present

## 2019-06-17 DIAGNOSIS — R400 Somnolence: Secondary | ICD-10-CM | POA: Diagnosis not present

## 2019-06-17 DIAGNOSIS — R51 Headache: Secondary | ICD-10-CM

## 2019-06-17 NOTE — Progress Notes (Signed)
Subjective:    Patient ID: Donna Freeman is a 48 y.o. female.  HPI     Donna Age, Donna Freeman, Donna Freeman [facility] [address], [address] P.O. [place], [place] [address]  Dear Donna Freeman,   I saw your patient, Donna Freeman, upon your kind request in my sleep clinic today for initial consultation of her sleep disorder, in particular, concern for underlying obstructive sleep apnea.  The patient is unaccompanied today.  As you know, Donna Freeman is a 48 year old right-handed woman with an underlying medical history of hypertension,Smoking, hyperlipidemia, reflux disease, anxiety, diabetes, von Willebrand disease, and obesity, who reports snoring and excessive daytime somnolence.  I reviewed your office note from 06/10/2019, which you kindly included. Her Epworth sleepiness score is 19 out of 24, fatigue severity score is 57 out of 63.  She has had increasing daytime somnolence for the past year.  She lives alone, has been divorced for over 20 years, no kids.  Has 1 dog and 2 cats in the household, 1 cat and her dog are typically on one side of the bed.  She is not bothered by the pets in her bedroom, she does have the TV on at night primarily for light, she tries to muted but often falls asleep after the news with the volume still on.  She has nocturia about 2 times per average night, sometimes 3.  She has woken up occasionally with a headache.  She has a family history of sleep apnea in 1 of her first cousins.  Her father died young in an accident at Freeman 71, her mom is 19 years old and has significant medical issues, and patient reports quite a bit of stress in dealing with her mom's illness.  She is an only child.  She denies any restless legs type symptoms.  She drinks caffeine in the form of coffee, 2 cups/day on average and 1 bottle of soda, 16 ounce per day, occasional tea.  She drinks alcohol at least 5 days out of the week, typically in the form of hard seltzer or a mixed drink.  She  smokes about a pack per day.  Bedtime is around 10 PM and rise time between 630 and 7 AM.  She works as an Optometrist.  She had a tonsillectomy as a teenager in college.  She has woken up with her own snoring or feeling of gasping for air.  She has woken up with her mouth dry. Her Past Medical History Is Significant For: Past Medical History:  Diagnosis Date  . Anxiety   . Diabetes mellitus without complication [facility])    patient states she has never had diabetes  . Dyslipidemia   . GERD (gastroesophageal reflux disease)   . Hypertension   . PONV (postoperative nausea and vomiting)    hx of strep kidney that left scars   . Von Willebrand disease ([place])     Her Past Surgical History Is Significant For: Past Surgical History:  Procedure Laterality Date  . LAPAROSCOPIC CHOLECYSTECTOMY SINGLE SITE WITH INTRAOPERATIVE CHOLANGIOGRAM N/A 12/30/2015   Procedure: LAPAROSCOPIC CHOLECYSTECTOMY SINGLE SITE WITH INTRAOPERATIVE CHOLANGIOGRAM;  Surgeon: Michael Boston, Donna Freeman;  Location: [facility] ORS;  Service: General;  Laterality: N/A;  . MOUTH SURGERY    . TONSILLECTOMY     and adenoidectomy     Her Family History Is Significant For: Family History  Problem Relation Freeman of Onset  . Heart attack Mother     Her Social History Is Significant For: Social  History   Socioeconomic History  . Marital status: Married    Spouse name: Not on file  . Number of children: Not on file  . Years of education: Not on file  . Highest education level: Not on file  Occupational History  . Not on file  Social Needs  . Financial resource strain: Not on file  . Food insecurity    Worry: Not on file    Inability: Not on file  . Transportation needs    Medical: Not on file    Non-medical: Not on file  Tobacco Use  . Smoking status: Current Every Day Smoker    Packs/day: 0.50    Years: 35.00    Pack years: 17.50    Types: Cigarettes  . Smokeless tobacco: Never Used  Substance and Sexual Activity  . Alcohol use:  Yes    Comment: 2 glasses of wine or beer every other day   . Drug use: No  . Sexual activity: Not on file  Lifestyle  . Physical activity    Days per week: Not on file    Minutes per session: Not on file  . Stress: Not on file  Relationships  . Social Musicianconnections    Talks on phone: Not on file    Gets together: Not on file    Attends religious service: Not on file    Active member of club or organization: Not on file    Attends meetings of clubs or organizations: Not on file    Relationship status: Not on file  Other Topics Concern  . Not on file  Social History Narrative  . Not on file    Her Allergies Are:  Allergies  Allergen Reactions  . Shellfish Allergy Anaphylaxis  . Other Itching    BANANAS- causes mouth itching and diarrhea  . Phenergan [Promethazine Hcl]     Vomiting, rash  :   Her Current Medications Are:  Outpatient Encounter Medications as of 06/17/2019  Medication Sig  . cetirizine (ZYRTEC) 10 MG tablet Take 10 mg by mouth daily.  . Cholecalciferol (VITAMIN D3 PO) Take 2,000 Units by mouth daily.  . citalopram (CELEXA) 20 MG tablet Take 20 mg by mouth daily.  Marland Kitchen. etonogestrel (IMPLANON) 68 MG IMPL implant Inject 1 each into the skin once. Placed 12/07/2014  . losartan-hydrochlorothiazide (HYZAAR) 50-12.5 MG tablet Take 1 tablet by mouth daily.  Marland Kitchen. omeprazole (PRILOSEC) 40 MG capsule Take 40 mg by mouth daily.  . [DISCONTINUED] HYDROcodone-acetaminophen (NORCO/VICODIN) 5-325 MG tablet Take 1-2 tablets by mouth every 4 (four) hours as needed for moderate pain or severe pain.  . [DISCONTINUED] ondansetron (ZOFRAN) 4 MG tablet Take 1 tablet (4 mg total) by mouth every 6 (six) hours.  . [DISCONTINUED] valACYclovir (VALTREX) 1000 MG tablet Take 1,000 mg by mouth 3 (three) times daily.   No facility-administered encounter medications on file as of 06/17/2019.   :  Review of Systems:  Out of a complete 14 point review of systems, all are reviewed and negative with  the exception of these symptoms as listed below:  Review of Systems  Neurological:       Pt presents today to discuss her sleep. Pt has never had a sleep study but does endorse snoring.  Epworth Sleepiness Scale 0= would never doze 1= slight chance of dozing 2= moderate chance of dozing 3= high chance of dozing  Sitting and reading: 3 Watching TV: 3 Sitting inactive in a public place (ex. Theater or meeting): 2  As a passenger in a car for an hour without a break: 3 Lying down to rest in the afternoon: 3 Sitting and talking to someone: 1 Sitting quietly after lunch (no alcohol): 3 In a car, while stopped in traffic: 1 Total: 19     Objective:  Neurological Exam  Physical Exam Physical Examination:   Vitals:   06/17/19 1551  BP: 133/84  Pulse: 84    General Examination: The patient is a very pleasant 48 y.o. female in no acute distress. She appears well-developed and well-nourished and well groomed.   HEENT: Normocephalic, atraumatic, pupils are equal, round and reactive to light and accommodation. Corrective eyeglasses in place. Extraocular tracking is good without limitation to gaze excursion or nystagmus noted. Normal smooth pursuit is noted. Hearing is grossly intact. Face is symmetric with normal facial animation and normal facial sensation. Speech is clear with no dysarthria noted. There is no hypophonia. There is no lip, neck/head, jaw or voice tremor. Neck is supple with full range of passive and active motion. There are no carotid bruits on auscultation. Oropharynx exam reveals: moderate mouth dryness, adequate dental hygiene and mild airway crowding, due to Small airway entry, slightly redundant soft palate, uvula small, tonsils absent, Mallampati is class II.  Neck circumference is 15-1/2 inches.  She has a minimal overbite.  Nasal inspection reveals very slight deviated septum to the left, otherwise no significant findings.  Chest: Clear to auscultation without  wheezing, rhonchi or crackles noted.  Heart: S1+S2+0, regular and normal without murmurs, rubs or gallops noted.   Abdomen: Soft, non-tender and non-distended with normal bowel sounds appreciated on auscultation.  Extremities: There is no pitting edema in the distal lower extremities bilaterally.   Skin: Warm and dry without trophic changes noted.  Musculoskeletal: exam reveals no obvious joint deformities, tenderness or joint swelling or erythema.   Neurologically:  Mental status: The patient is awake, alert and oriented in all 4 spheres. Her immediate and remote memory, attention, language skills and fund of knowledge are appropriate. There is no evidence of aphasia, agnosia, apraxia or anomia. Speech is clear with normal prosody and enunciation. Thought process is linear. Mood is normal and affect is normal.  Cranial nerves II - XII are as described above under HEENT exam. In addition: shoulder shrug is normal with equal shoulder height noted. Motor exam: Normal bulk, strength and tone is noted. There is no drift, tremor or rebound. Romberg is negative. Fine motor skills and coordination: intact grossly.   Cerebellar testing: No dysmetria or intention tremor. There is no truncal or gait ataxia.  Sensory exam: intact to light touch in the upper and lower extremities.  Gait, station and balance: She stands easily. No veering to one side is noted. No leaning to one side is noted. Posture is Freeman-appropriate and stance is narrow based. Gait shows normal stride length and normal pace. No problems turning are noted. Tandem walk is challenging for her.     Assessment and Plan:  In summary, Hyman BibleShanra K Brink is a very pleasant 48 y.o.-year old female with an underlying medical history of hypertension,Smoking, hyperlipidemia, reflux disease, anxiety, diabetes, von Willebrand disease, and obesity, whose history and physical exam are concerning for obstructive sleep apnea (OSA). I had a long chat with the  patient about my findings and the diagnosis of OSA, its prognosis and treatment options. We talked about medical treatments, surgical interventions and non-pharmacological approaches. I explained in particular the risks and ramifications of untreated moderate to severe  OSA, especially with respect to developing cardiovascular disease down the Road, including congestive heart failure, difficult to treat hypertension, cardiac arrhythmias, or stroke. Even type 2 diabetes has, in part, been linked to untreated OSA. Symptoms of untreated OSA include daytime sleepiness, memory problems, mood irritability and mood disorder such as depression and anxiety, lack of energy, as well as recurrent headaches, especially morning headaches. We talked about smoking cessation and trying to maintain a healthy lifestyle in general, as well as the importance of weight control. I encouraged the patient to eat healthy, exercise daily and keep well hydrated, to keep a scheduled bedtime and wake time routine, to not skip any meals and eat healthy snacks in between meals. I advised the patient not to drive when feeling sleepy. I recommended the following at this time: sleep study.   I explained the sleep test procedure to the patient and also outlined possible surgical and non-surgical treatment options of OSA, including the use of a custom-made dental device (which would require a referral to a specialist dentist or oral surgeon), upper airway surgical options, such as pillar implants, radiofrequency surgery, tongue base surgery, and UPPP (which would involve a referral to an ENT surgeon). Rarely, jaw surgery such as mandibular advancement may be considered.  I also explained the CPAP treatment option to the patient, who indicated that she would be willing to try CPAP if the need arises. I explained the importance of being compliant with PAP treatment, not only for insurance purposes but primarily to improve Her symptoms, and for the  patient's long term health benefit, including to reduce Her cardiovascular risks. I answered all her questions today and the patient was in agreement. I plan to see her back after the sleep study is completed and encouraged her to call with any interim questions, concerns, problems or updates.   Thank you very much for allowing me to participate in the care of this nice patient. If I can be of any further assistance to you please do not hesitate to call me at [phone].  Sincerely,   Huston FoleySaima Merideth Bosque, Donna Freeman, Donna Freeman

## 2019-06-17 NOTE — Patient Instructions (Signed)

## 2019-07-07 ENCOUNTER — Ambulatory Visit (INDEPENDENT_AMBULATORY_CARE_PROVIDER_SITE_OTHER): Payer: 59 | Admitting: Neurology

## 2019-07-07 DIAGNOSIS — R351 Nocturia: Secondary | ICD-10-CM

## 2019-07-07 DIAGNOSIS — R0683 Snoring: Secondary | ICD-10-CM

## 2019-07-07 DIAGNOSIS — F17200 Nicotine dependence, unspecified, uncomplicated: Secondary | ICD-10-CM

## 2019-07-07 DIAGNOSIS — G4733 Obstructive sleep apnea (adult) (pediatric): Secondary | ICD-10-CM | POA: Diagnosis not present

## 2019-07-07 DIAGNOSIS — E669 Obesity, unspecified: Secondary | ICD-10-CM

## 2019-07-07 DIAGNOSIS — R519 Headache, unspecified: Secondary | ICD-10-CM

## 2019-07-07 DIAGNOSIS — R400 Somnolence: Secondary | ICD-10-CM

## 2019-07-17 ENCOUNTER — Encounter: Payer: Self-pay | Admitting: Neurology

## 2019-07-17 DIAGNOSIS — G4733 Obstructive sleep apnea (adult) (pediatric): Secondary | ICD-10-CM

## 2019-07-17 NOTE — Procedures (Signed)
Duplicate order.

## 2019-07-17 NOTE — Procedures (Signed)
Patient Information     First Name: Donna EeShanra Last Name: Neita Freeman ID: [id number]  Birth Date: Oct 02, 1971 Age: 48 Gender: Female  Referring Provider: Gaspar Garbeisovec, Richard W, MD BMI: 33.6 (W=214 lb, H=5' 7'')  Neck Circ.:  15 '' Epworth:  19/24   Sleep Study Information    Study Date: Jul 07, 2019 S/H/A Version: 5.1.77.7 / 4.1.1528 / 6577          History:     48 year old woman with a history of hypertension, Smoking, hyperlipidemia, reflux disease, anxiety, diabetes, von Willebrand disease, and obesity, who reports snoring and excessive daytime somnolence.   Summary & Diagnosis:     Severe OSA   Recommendations:       This home sleep test demonstrates severe obstructive sleep apnea with a total AHI of 68.5/hour and O2 nadir of 78%. Given the patient's medical history and sleep related complaints, treatment with positive airway pressure (in the form of CPAP) is recommended. This will require a full night CPAP titration study for proper treatment settings, O2 monitoring and mask fitting. Based on the severity of the sleep disordered breathing an attended titration study is indicated. However, patient's insurance has denied an attended sleep study; therefore, the patient will be advised to proceed with an autoPAP titration/trial at home for now. Please note that untreated obstructive sleep apnea may carry additional perioperative morbidity. Patients with significant obstructive sleep apnea should receive perioperative PAP therapy and the surgeons and particularly the anesthesiologist should be informed of the diagnosis and the severity of the sleep disordered breathing. The patient should be cautioned not to drive, work at heights, or operate dangerous or heavy equipment when tired or sleepy. Review and reiteration of good sleep hygiene measures should be pursued with any patient. Other causes of the patient's symptoms, including circadian rhythm disturbances, an underlying mood disorder, medication  effect and/or an underlying medical problem cannot be ruled out based on this test. Clinical correlation is recommended. The patient and his referring provider will be notified of the test results. [facility]  I certify that I have reviewed the raw data recording prior to the issuance of this report in accordance with the standards of the American Academy of Sleep Medicine (AASM).  Huston FoleySaima Linnette Panella, MD, PhD [facility] Diplomat, ABPN (Neurology and Sleep)          Sleep Summary  Oxygen Saturation Statistics   Start Study Time: End Study Time: Total Recording Time:  10:04:49 PM   6:36:04 AM   8 h, 31 min  Total Sleep Time % REM of Sleep Time:  7 h, 12 min 17.0    Mean: 94 Minimum: 78 Maximum: 100  Mean of Desaturations Nadirs (%):   89  Oxygen Desaturation. %:  4-9 10-20 >20 Total  Events Number Total   286 72.2  109  1 27.5 0.3  396 100.0  Oxygen Saturation: <90 <=88  <85 <80 <70  Duration (minutes): Sleep % 28.8 6.7 22.4 5.2  5.7 0.2  1.3 0.0 0.0 0.0     Respiratory Indices      Total Events REM NREM All Night  pRDI:  476  pAHI:  474 ODI:  396  pAHIc:  93  % CSR: 16.4 72.5 72.5 58.5 23.9 68.0 67.6 56.9 11.2 68.8 68.5 57.2 13.4       Pulse Rate Statistics during Sleep (BPM)      Mean:  72 Minimum: 40 Maximum: 101    Indices are calculated using technically valid sleep time of  6 hrs, 55 min. pRDI/pAHI are calculated using oxi desaturations ? 3%  Body Position Statistics  Position Supine Prone Right Left Non-Supine  Sleep (min) 404.3 0.0 26.0 2.5 28.5  Sleep % 93.4 0.0 6.0 0.6 6.6  pRDI 67.0 N/A 93.9 N/A 94.4  pAHI 66.7 N/A 93.9 N/A 94.4  ODI 55.0 N/A 91.5 N/A 89.8     Snoring Statistics Snoring Level (dB) >40 >50 >60 >70 >80 >Threshold (45)  Sleep (min) 256.5 81.5 5.6 0.0 0.0 178.8  Sleep % 59.3 18.8 1.3 0.0 0.0 41.3    Mean: 45 dB Sleep Stages Chart                                                                              pAHI=68.5                                                                                              Mild              Moderate                    Severe                                                 5              15                    30

## 2019-07-17 NOTE — Addendum Note (Signed)
Addended by: Star Age on: 07/17/2019 07:35 PM   Modules accepted: Orders

## 2019-07-17 NOTE — Progress Notes (Signed)
Patient referred by Dr. Loren Racer office, seen by me on 06/17/19, HST on 07/07/19.    Please call and notify the patient that the recent home sleep test showed obstructive sleep apnea in the severe range. While I recommend treatment for this in the form CPAP, her insurance will not approve a sleep study for this. They will likely only approve a trial of autoPAP, which means, that we don't have to bring her in for a sleep study with CPAP, but will let her start using a so called autoPAP machine at home, through a DME company (of her choice, or as per insurance requirement). The DME representative will educate her on how to use the machine, how to put the mask on, etc. I have placed an order in the chart. Please send referral, talk to patient, send report to referring MD. We will need a FU in sleep clinic for 10 weeks post-PAP set up, please arrange that with me or one of our NPs. Thanks,   Star Age, MD, PhD [facility])

## 2019-07-18 ENCOUNTER — Telehealth: Payer: Self-pay | Admitting: Neurology

## 2019-07-18 NOTE — Telephone Encounter (Signed)
Pt is asking for a call with the results to her sleep study 

## 2019-07-21 ENCOUNTER — Telehealth: Payer: Self-pay

## 2019-07-21 NOTE — Telephone Encounter (Signed)
-----   Message from Star Age, MD sent at 07/17/2019  7:35 PM EDT ----- Patient referred by Dr. Loren Racer office, seen by me on 06/17/19, HST on 07/07/19.    Please call and notify the patient that the recent home sleep test showed obstructive sleep apnea in the severe range. While I recommend treatment for this in the form CPAP, her insurance will not approve a sleep study for this. They will likely only approve a trial of autoPAP, which means, that we don't have to bring her in for a sleep study with CPAP, but will let her start using a so called autoPAP machine at home, through a DME company (of her choice, or as per insurance requirement). The DME representative will educate her on how to use the machine, how to put the mask on, etc. I have placed an order in the chart. Please send referral, talk to patient, send report to referring MD. We will need a FU in sleep clinic for 10 weeks post-PAP set up, please arrange that with me or one of our NPs. Thanks,   Star Age, MD, PhD [facility])

## 2019-07-21 NOTE — Telephone Encounter (Signed)
I called pt. I advised pt that Dr. Rexene Alberts reviewed their sleep study results and found that pt has severe osa. Dr. Rexene Alberts recommends that pt start an auto pap at home. I reviewed PAP compliance expectations with the pt. Pt is agreeable to starting an auto-PAP. I advised pt that an order will be sent to a DME, [facility], and [facility] will call the pt within about one week after they file with the pt's insurance. [facility] will show the pt how to use the machine, fit for masks, and troubleshoot the auto-PAP if needed. A follow up appt was made for insurance purposes with Amy, NP on 09/24/19 at 7:30am. Pt verbalized understanding to arrive 15 minutes early and bring their auto-PAP. A letter with all of this information in it will be mailed to the pt as a reminder. I verified with the pt that the address we have on file is correct. Pt verbalized understanding of results. Pt had no questions at this time but was encouraged to call back if questions arise. I have sent the order to [facility] and have received confirmation that they have received the order.

## 2019-07-21 NOTE — Telephone Encounter (Signed)
I called pt to discuss her sleep study results. The number on her chart is not in service. I will try again later.

## 2019-07-21 NOTE — Telephone Encounter (Signed)
See telephone note from 07/21/19. 

## 2019-07-21 NOTE — Telephone Encounter (Signed)
Pt is asking for a call back from RN Kristen °

## 2019-07-21 NOTE — Telephone Encounter (Signed)
I called pt again to discuss, no answer, left a message asking her to call me back. 

## 2019-07-21 NOTE — Telephone Encounter (Signed)
Pt called back and states the other number will not ring for some reason and will go straight to VM. Please call her desk at [phone].

## 2019-07-21 NOTE — Telephone Encounter (Signed)
I called pt to discuss her sleep study results, used [phone], no answer, left a message asking her to call me back.

## 2019-08-14 NOTE — Progress Notes (Signed)
erro  neous encounter

## 2019-09-06 ENCOUNTER — Other Ambulatory Visit: Payer: Self-pay

## 2019-09-06 ENCOUNTER — Encounter (HOSPITAL_COMMUNITY): Payer: Self-pay | Admitting: Emergency Medicine

## 2019-09-06 DIAGNOSIS — Z5321 Procedure and treatment not carried out due to patient leaving prior to being seen by health care provider: Secondary | ICD-10-CM | POA: Insufficient documentation

## 2019-09-06 DIAGNOSIS — Y929 Unspecified place or not applicable: Secondary | ICD-10-CM | POA: Insufficient documentation

## 2019-09-06 DIAGNOSIS — Y999 Unspecified external cause status: Secondary | ICD-10-CM | POA: Diagnosis not present

## 2019-09-06 DIAGNOSIS — T23002A Burn of unspecified degree of left hand, unspecified site, initial encounter: Secondary | ICD-10-CM | POA: Diagnosis not present

## 2019-09-06 DIAGNOSIS — T2007XA Burn of unspecified degree of neck, initial encounter: Secondary | ICD-10-CM | POA: Insufficient documentation

## 2019-09-06 DIAGNOSIS — X030XXA Exposure to flames in controlled fire, not in building or structure, initial encounter: Secondary | ICD-10-CM | POA: Diagnosis not present

## 2019-09-06 DIAGNOSIS — Y939 Activity, unspecified: Secondary | ICD-10-CM | POA: Insufficient documentation
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
## 2019-09-07 ENCOUNTER — Emergency Department (HOSPITAL_COMMUNITY)
Admission: EM | Admit: 2019-09-07 | Discharge: 2019-09-07 | Disposition: A | Discharge status: Home / Self Care | Payer: 59 | Attending: Emergency Medicine | Admitting: Emergency Medicine

## 2019-09-07 NOTE — ED Notes (Signed)
Pt states she is tired of waiting and is leaving to find a real hospital.

## 2019-09-24 ENCOUNTER — Other Ambulatory Visit: Payer: Self-pay

## 2019-09-24 ENCOUNTER — Encounter: Payer: Self-pay | Admitting: Family Medicine

## 2019-09-24 ENCOUNTER — Ambulatory Visit: Payer: 59 | Admitting: Family Medicine

## 2019-09-24 VITALS — BP 141/74 | HR 88 | Temp 97.7°F | Ht 68.0 in | Wt 213.4 lb

## 2019-09-24 DIAGNOSIS — Z9989 Dependence on other enabling machines and devices: Secondary | ICD-10-CM | POA: Diagnosis not present

## 2019-09-24 DIAGNOSIS — G4733 Obstructive sleep apnea (adult) (pediatric): Secondary | ICD-10-CM | POA: Diagnosis not present

## 2019-09-24 NOTE — Patient Instructions (Signed)
We will adjust pressures settings to max pressure of 15cmH20. Please continue to use CPAP nightly and for greater than 4 hours each night.   Follow up in 6 weeks for repeat compliance review.   Sleep Apnea Sleep apnea affects breathing during sleep. It causes breathing to stop for a short time or to become shallow. It can also increase the risk of:  Heart attack.  Stroke.  Being very overweight (obese).  Diabetes.  Heart failure.  Irregular heartbeat. The goal of treatment is to help you breathe normally again. What are the causes? There are three kinds of sleep apnea:  Obstructive sleep apnea. This is caused by a blocked or collapsed airway.  Central sleep apnea. This happens when the brain does not send the right signals to the muscles that control breathing.  Mixed sleep apnea. This is a combination of obstructive and central sleep apnea. The most common cause of this condition is a collapsed or blocked airway. This can happen if:  Your throat muscles are too relaxed.  Your tongue and tonsils are too large.  You are overweight.  Your airway is too small. What increases the risk?  Being overweight.  Smoking.  Having a small airway.  Being older.  Being female.  Drinking alcohol.  Taking medicines to calm yourself (sedatives or tranquilizers).  Having family members with the condition. What are the signs or symptoms?  Trouble staying asleep.  Being sleepy or tired during the day.  Getting angry a lot.  Loud snoring.  Headaches in the morning.  Not being able to focus your mind (concentrate).  Forgetting things.  Less interest in sex.  Mood swings.  Personality changes.  Feelings of sadness (depression).  Waking up a lot during the night to pee (urinate).  Dry mouth.  Sore throat. How is this diagnosed?  Your medical history.  A physical exam.  A test that is done when you are sleeping (sleep study). The test is most often done in  a sleep lab but may also be done at home. How is this treated?   Sleeping on your side.  Using a medicine to get rid of mucus in your nose (decongestant).  Avoiding the use of alcohol, medicines to help you relax, or certain pain medicines (narcotics).  Losing weight, if needed.  Changing your diet.  Not smoking.  Using a machine to open your airway while you sleep, such as: ? An oral appliance. This is a mouthpiece that shifts your lower jaw forward. ? A CPAP device. This device blows air through a mask when you breathe out (exhale). ? An EPAP device. This has valves that you put in each nostril. ? A BPAP device. This device blows air through a mask when you breathe in (inhale) and breathe out.  Having surgery if other treatments do not work. It is important to get treatment for sleep apnea. Without treatment, it can lead to:  High blood pressure.  Coronary artery disease.  In men, not being able to have an erection (impotence).  Reduced thinking ability. Follow these instructions at home: Lifestyle  Make changes that your doctor recommends.  Eat a healthy diet.  Lose weight if needed.  Avoid alcohol, medicines to help you relax, and some pain medicines.  Do not use any products that contain nicotine or tobacco, such as cigarettes, e-cigarettes, and chewing tobacco. If you need help quitting, ask your doctor. General instructions  Take over-the-counter and prescription medicines only as told by your doctor.  If you were given a machine to use while you sleep, use it only as told by your doctor.  If you are having surgery, make sure to tell your doctor you have sleep apnea. You may need to bring your device with you.  Keep all follow-up visits as told by your doctor. This is important. Contact a doctor if:  The machine that you were given to use during sleep bothers you or does not seem to be working.  You do not get better.  You get worse. Get help right  away if:  Your chest hurts.  You have trouble breathing in enough air.  You have an uncomfortable feeling in your back, arms, or stomach.  You have trouble talking.  One side of your body feels weak.  A part of your face is hanging down. These symptoms may be an emergency. Do not wait to see if the symptoms will go away. Get medical help right away. Call your local emergency services (911 in the [place] Do not drive yourself to the hospital. Summary  This condition affects breathing during sleep.  The most common cause is a collapsed or blocked airway.  The goal of treatment is to help you breathe normally while you sleep. This information is not intended to replace advice given to you by your health care provider. Make sure you discuss any questions you have with your health care provider. Document Released: 08/15/2008 Document Revised: 08/23/2018 Document Reviewed: 07/02/2018 Elsevier Patient Education  2020 [employer].

## 2019-09-24 NOTE — Progress Notes (Addendum)
PATIENT: Donna Freeman DOB: 14-Dec-1970  REASON FOR VISIT: follow up HISTORY FROM: patient  Chief Complaint  Patient presents with  . Follow-up    Initial auto pap f/u. Alone. Rm 1. Patient mentioned that she is having a hard time getting a good seal around her nose.      HISTORY OF PRESENT ILLNESS: Today 09/24/19 Donna Freeman is a 48 y.o. female here today for follow up of OSA on CPAP. Home study showed severe OSA with AHI 68.5 and O2 nadir of 78%. This is her initial compliance visit.  She reports that she is doing fairly well on CPAP therapy.  She is continuing to adjust to her mask.  She is currently using a fullface mask.  She feels that there is a little bit of air that leaks from the bridge of her nose.  She definitely notes improvement in sleep and daytime energy since starting CPAP therapy.  Compliance report dated 08/23/2019 through 09/21/2019 reveals that she has used CPAP 29 of the last 30 days for compliance of 97%.  21 days she used CPAP greater than 4 hours for compliance of 70%.  Average usage was 5 hours and 9 minutes.  AHI was 11.1 on 7 to 14 cm of water and an EPR of 3.  Pressure in the 95th percentile of 13.3.  There was no significant leak noted.  HISTORY: (copied from Dr Teofilo Pod note on 06/17/2019)  Dear Donna Freeman,   I saw your patient, Donna Freeman, upon your kind request in my sleep clinic today for initial consultation of her sleep disorder, in particular, concern for underlying obstructive sleep apnea.  The patient is unaccompanied today.  As you know, Ms. Cocke is a 48 year old right-handed woman with an underlying medical history of hypertension,Smoking, hyperlipidemia, reflux disease, anxiety, diabetes, von Willebrand disease, and obesity, who reports snoring and excessive daytime somnolence.  I reviewed your office note from 06/10/2019, which you kindly included. Her Epworth sleepiness score is 19 out of 24, fatigue severity score is 57 out of 63.  She has had  increasing daytime somnolence for the past year.  She lives alone, has been divorced for over 20 years, no kids.  Has 1 dog and 2 cats in the household, 1 cat and her dog are typically on one side of the bed.  She is not bothered by the pets in her bedroom, she does have the TV on at night primarily for light, she tries to muted but often falls asleep after the news with the volume still on.  She has nocturia about 2 times per average night, sometimes 3.  She has woken up occasionally with a headache.  She has a family history of sleep apnea in 1 of her first cousins.  Her father died young in an accident at age 69, her mom is 65 years old and has significant medical issues, and patient reports quite a bit of stress in dealing with her mom's illness.  She is an only child.  She denies any restless legs type symptoms.  She drinks caffeine in the form of coffee, 2 cups/day on average and 1 bottle of soda, 16 ounce per day, occasional tea.  She drinks alcohol at least 5 days out of the week, typically in the form of hard seltzer or a mixed drink.  She smokes about a pack per day.  Bedtime is around 10 PM and rise time between 630 and 7 AM.  She works as an Airline pilot.  She had a tonsillectomy as a teenager in college.  She has woken up with her own snoring or feeling of gasping for air.  She has woken up with her mouth dry.   REVIEW OF SYSTEMS: Out of a complete 14 system review of symptoms, the patient complains only of the following symptoms, none and all other reviewed systems are negative.  Epworth sleepiness scale: 9 Fatigue severity scale: 20  ALLERGIES: Allergies  Allergen Reactions  . Shellfish Allergy Anaphylaxis  . Other Itching    BANANAS- causes mouth itching and diarrhea  . Phenergan [Promethazine Hcl]     Vomiting, rash    HOME MEDICATIONS: Outpatient Medications Prior to Visit  Medication Sig Dispense Refill  . cetirizine (ZYRTEC) 10 MG tablet Take 10 mg by mouth daily.    .  citalopram (CELEXA) 20 MG tablet Take 20 mg by mouth daily.    Marland Kitchen. etonogestrel (IMPLANON) 68 MG IMPL implant Inject 1 each into the skin once. Placed 12/07/2014    . losartan-hydrochlorothiazide (HYZAAR) 50-12.5 MG tablet Take 1 tablet by mouth daily.    Marland Kitchen. omeprazole (PRILOSEC) 20 MG capsule Take 20 mg by mouth daily.     No facility-administered medications prior to visit.     PAST MEDICAL HISTORY: Past Medical History:  Diagnosis Date  . Anxiety   . Diabetes mellitus without complication [facility](HCC)    patient states she has never had diabetes  . Dyslipidemia   . GERD (gastroesophageal reflux disease)   . Hypertension   . PONV (postoperative nausea and vomiting)    hx of strep kidney that left scars   . Von Willebrand disease (HCC)     PAST SURGICAL HISTORY: Past Surgical History:  Procedure Laterality Date  . LAPAROSCOPIC CHOLECYSTECTOMY SINGLE SITE WITH INTRAOPERATIVE CHOLANGIOGRAM N/A 12/30/2015   Procedure: LAPAROSCOPIC CHOLECYSTECTOMY SINGLE SITE WITH INTRAOPERATIVE CHOLANGIOGRAM;  Surgeon: Karie SodaSteven Gross, MD;  Location: [facility];  Service: General;  Laterality: N/A;  . MOUTH SURGERY    . TONSILLECTOMY     and adenoidectomy     FAMILY HISTORY: Family History  Problem Relation Age of Onset  . Heart attack Mother     SOCIAL HISTORY: Social History   Socioeconomic History  . Marital status: Married    Spouse name: Not on file  . Number of children: Not on file  . Years of education: Not on file  . Highest education level: Not on file  Occupational History  . Not on file  Social Needs  . Financial resource strain: Not on file  . Food insecurity    Worry: Not on file    Inability: Not on file  . Transportation needs    Medical: Not on file    Non-medical: Not on file  Tobacco Use  . Smoking status: Current Every Day Smoker    Packs/day: 0.50    Years: 35.00    Pack years: 17.50    Types: Cigarettes  . Smokeless tobacco: Never Used  Substance and Sexual Activity   . Alcohol use: Yes    Comment: 2 glasses of wine or beer every other day   . Drug use: No  . Sexual activity: Not on file  Lifestyle  . Physical activity    Days per week: Not on file    Minutes per session: Not on file  . Stress: Not on file  Relationships  . Social Musicianconnections    Talks on phone: Not on file    Gets together: Not on file  Attends religious service: Not on file    Active member of club or organization: Not on file    Attends meetings of clubs or organizations: Not on file    Relationship status: Not on file  . Intimate partner violence    Fear of current or ex partner: Not on file    Emotionally abused: Not on file    Physically abused: Not on file    Forced sexual activity: Not on file  Other Topics Concern  . Not on file  Social History Narrative  . Not on file      PHYSICAL EXAM  Vitals:   09/24/19 0729  BP: (!) 141/74  Pulse: 88  Temp: 97.7 F (36.5 C)  TempSrc: Oral  Weight: 213 lb 6.4 oz (96.8 kg)  Height:  (1.727 m)   Body mass index is 32.45 kg/m.  Generalized: Well developed, in no acute distress  Cardiology: normal rate and rhythm, no murmur noted Respiratory: Clear to auscultation bilaterally Mallampati 3+ Neurological examination  Mentation: Alert oriented to time, place, history taking. Follows all commands speech and language fluent Cranial nerve II-XII: Pupils were equal round reactive to light. Extraocular movements were full, visual field were full on confrontational test. Facial sensation and strength were normal. Uvula tongue midline. Head turning and shoulder shrug  were normal and symmetric. Motor: The motor testing reveals 5 over 5 strength of all 4 extremities. Good symmetric motor tone is noted throughout.  Gait and station: Gait is normal.   DIAGNOSTIC DATA (LABS, IMAGING, TESTING) - I reviewed patient records, labs, notes, testing and imaging myself where available.  No flowsheet data found.   Lab Results   Component Value Date   WBC 10.1 12/27/2015   HGB 16.1 (H) 12/27/2015   HCT 48.6 (H) 12/27/2015   MCV 85.4 12/27/2015   PLT 219 12/27/2015      Component Value Date/Time   NA 141 12/27/2015 0004   K 3.2 (L) 12/27/2015 0004   CL 102 12/27/2015 0004   CO2 24 12/27/2015 0004   GLUCOSE 128 (H) 12/27/2015 0004   BUN 7 12/27/2015 0004   CREATININE 0.96 12/27/2015 0004   CALCIUM 9.6 12/27/2015 0004   PROT 6.8 12/27/2015 0004   ALBUMIN 4.1 12/27/2015 0004   AST 28 12/27/2015 0004   ALT 23 12/27/2015 0004   ALKPHOS 69 12/27/2015 0004   BILITOT 0.6 12/27/2015 0004   GFRNONAA >60 12/27/2015 0004   GFRAA >60 12/27/2015 0004   No results found for: CHOL, HDL, LDLCALC, LDLDIRECT, TRIG, CHOLHDL No results found for: ONGE9B No results found for: VITAMINB12 No results found for: TSH     ASSESSMENT AND PLAN 48 y.o. year old female  has a past medical history of Anxiety, Diabetes mellitus without complication (HCC), Dyslipidemia, GERD (gastroesophageal reflux disease), Hypertension, PONV (postoperative nausea and vomiting), and Von Willebrand disease (HCC). here with     ICD-10-CM   1. Severe obstructive sleep apnea  G47.33 For home use only DME continuous positive airway pressure (CPAP)  2. OSA on CPAP  G47.33 For home use only DME continuous positive airway pressure (CPAP)   Z99.89     Ayat is doing well on CPAP therapy.  She has noted significant improvement in her sleep quality and increased energy levels.  Compliance report reveals excellent daily compliance.  She was advised to continue working towards a goal of a minimum of 4-hour usage.  AHI remains slightly elevated.  I will increase maximum pressure from a  14 cm of water to 15 cm of water. We will follow up in 6 weeks to assess response to pressure changes. She verbalizes understanding and agreement with this plan.    Orders Placed This Encounter  Procedures  . For home use only DME continuous positive airway pressure (CPAP)     Continue minimum pressure of 7cmH20. Increase max pressure from 14cmH20 to 15cmH20. EPR 3.    Order Specific Question:   Length of Need    Answer:   Lifetime    Order Specific Question:   Patient has OSA or probable OSA    Answer:   Yes    Order Specific Question:   Is the patient currently using CPAP in the home    Answer:   Yes    Order Specific Question:   Settings    Answer:   Other see comments    Order Specific Question:   CPAP supplies needed    Answer:   Mask, headgear, cushions, filters, heated tubing and water chamber     No orders of the defined types were placed in this encounter.     I spent 15 minutes with the patient. 50% of this time was spent counseling and educating patient on plan of care and medications.    Debbora Presto, FNP-C 09/24/2019, 7:34 AM [facility] [address], [place], [place] [address] [phone]  I reviewed the above note and documentation by the Nurse Practitioner and agree with the history, exam, assessment and plan as outlined above. I was available for consultation. Star Age, MD, PhD [facility])

## 2019-11-09 NOTE — Progress Notes (Addendum)
PATIENT: Donna Freeman DOB: 05/25/1971  REASON FOR VISIT: follow up HISTORY FROM: patient  Chief Complaint  Patient presents with  . Follow-up    6 wk f/u. Alone. Rm 2. No new concerns at this time.      HISTORY OF PRESENT ILLNESS: Today 11/10/19 Donna Freeman is a 48 y.o. female here today for follow up of OSA on CPAP.  She was recently seen for compliance review.  Despite excellent compliance, her residual AHI remained elevated at 11.1.  We increased her maximum pressure from 14 to 15 cm of water pressure.  She has tolerated this increase very well.  She continues to use CPAP nightly.  She does report some trouble with completing her 4-hour goal.  She contributes this to restless sleep and increased stress as she is caring for her mother who is ill.  She does note benefit of using CPAP nightly.  Compliance report dated 10/07/2019 through 11/05/2019 reveals that she use CPAP 30 of the last 30 nights for compliance of 90%.  20 of the last 30 nights she used CPAP greater than 4 hours for compliance of 67%.  Average usage was 5 hours and 16 minutes.  Residual AHI is 7.7 on 7 to 15 cm water and an EPR of 3.  Pressure in the 95th percentile at 13.9 with maximum pressure 14.4 there was no significant leak noted.  HISTORY: (copied from my note on 09/24/2019)  Donna Freeman is a 48 y.o. female here today for follow up of OSA on CPAP. Home study showed severe OSA with AHI 68.5 and O2 nadir of 78%. This is her initial compliance visit.  She reports that she is doing fairly well on CPAP therapy.  She is continuing to adjust to her mask.  She is currently using a fullface mask.  She feels that there is a little bit of air that leaks from the bridge of her nose.  She definitely notes improvement in sleep and daytime energy since starting CPAP therapy.  Compliance report dated 08/23/2019 through 09/21/2019 reveals that she has used CPAP 29 of the last 30 days for compliance of 97%.  21 days she  used CPAP greater than 4 hours for compliance of 70%.  Average usage was 5 hours and 9 minutes.  AHI was 11.1 on 7 to 14 cm of water and an EPR of 3.  Pressure in the 95th percentile of 13.3.  There was no significant leak noted.  HISTORY: (copied from Dr Teofilo PodAthar's note on 06/17/2019)  Dear Fannie KneeSue,   I saw your patient, Donna Freeman, upon your kind request in my sleep clinic today for initial consultation of her sleep disorder, in particular, concern for underlying obstructive sleep apnea. The patient is unaccompanied today. As you know, Donna Freeman is a 48 year old right-handed woman with an underlying medical history of hypertension,Smoking,hyperlipidemia, reflux disease, anxiety, diabetes, von Willebrand disease, and obesity, who reports snoring and excessive daytime somnolence. I reviewed your office note from 06/10/2019, which you kindly included. Her Epworth sleepiness score is 19 out of 24, fatigue severity score is 57 out of 63. She has had increasing daytime somnolence for the past year. She lives alone, has been divorced for over 20 years, no kids. Has 1 dog and 2 cats in the household, 1 cat and her dog are typically on one side of the bed. She is not bothered by the pets in her bedroom, she does have the TV on at night primarily for  light, she tries to muted but often falls asleep after the news with the volume still on. She has nocturia about 2 times per average night, sometimes 3. She has woken up occasionally with a headache. She has a family history of sleep apnea in 1 of her first cousins. Her father died young in an accident at age 38, her mom is 34 years old and has significant medical issues, and patient reports quite a bit of stress in dealing with her mom's illness. She is an only child. She denies any restless legs type symptoms. She drinks caffeine in the form of coffee, 2 cups/day on average and 1 bottle of soda, 16 ounce per day, occasional tea. She drinks alcohol at  least 5 days out of the week, typically in the form of hard seltzer or a mixed drink. She smokes about a pack per day. Bedtime is around 10 PM and rise time between 630 and 7 AM. She works as an Airline pilot. She had a tonsillectomy as a teenager in college. She has woken up with her own snoring or feeling of gasping for air. She has woken up with her mouth dry.   REVIEW OF SYSTEMS: Out of a complete 14 system review of symptoms, the patient complains only of the following symptoms, none and all other reviewed systems are negative.  Epworth Sleepiness Scale: 12 Fatigue severity scale: 22  ALLERGIES: Allergies  Allergen Reactions  . Shellfish Allergy Anaphylaxis  . Other Itching    BANANAS- causes mouth itching and diarrhea  . Phenergan [Promethazine Hcl]     Vomiting, rash    HOME MEDICATIONS: Outpatient Medications Prior to Visit  Medication Sig Dispense Refill  . cetirizine (ZYRTEC) 10 MG tablet Take 10 mg by mouth daily.    . citalopram (CELEXA) 20 MG tablet Take 20 mg by mouth daily.    Marland Kitchen etonogestrel (IMPLANON) 68 MG IMPL implant Inject 1 each into the skin once. Placed 12/07/2014    . losartan-hydrochlorothiazide (HYZAAR) 50-12.5 MG tablet Take 1 tablet by mouth daily.    Marland Kitchen omeprazole (PRILOSEC) 20 MG capsule Take 20 mg by mouth daily.     No facility-administered medications prior to visit.    PAST MEDICAL HISTORY: Past Medical History:  Diagnosis Date  . Anxiety   . Diabetes mellitus without complication [facility])    patient states she has never had diabetes  . Dyslipidemia   . GERD (gastroesophageal reflux disease)   . Hypertension   . PONV (postoperative nausea and vomiting)    hx of strep kidney that left scars   . Von Willebrand disease (HCC)     PAST SURGICAL HISTORY: Past Surgical History:  Procedure Laterality Date  . LAPAROSCOPIC CHOLECYSTECTOMY SINGLE SITE WITH INTRAOPERATIVE CHOLANGIOGRAM N/A 12/30/2015   Procedure: LAPAROSCOPIC CHOLECYSTECTOMY SINGLE  SITE WITH INTRAOPERATIVE CHOLANGIOGRAM;  Surgeon: Karie Soda, MD;  Location: [facility];  Service: General;  Laterality: N/A;  . MOUTH SURGERY    . TONSILLECTOMY     and adenoidectomy     FAMILY HISTORY: Family History  Problem Relation Age of Onset  . Heart attack Mother     SOCIAL HISTORY: Social History   Socioeconomic History  . Marital status: Married    Spouse name: Not on file  . Number of children: Not on file  . Years of education: Not on file  . Highest education level: Not on file  Occupational History  . Not on file  Tobacco Use  . Smoking status: Current Every Day Smoker  Packs/day: 0.50    Years: 35.00    Pack years: 17.50    Types: Cigarettes  . Smokeless tobacco: Never Used  Substance and Sexual Activity  . Alcohol use: Yes    Comment: 2 glasses of wine or beer every other day   . Drug use: No  . Sexual activity: Not on file  Other Topics Concern  . Not on file  Social History Narrative  . Not on file   Social Determinants of Health   Financial Resource Strain:   . Difficulty of Paying Living Expenses: Not on file  Food Insecurity:   . Worried About Charity fundraiser in the Last Year: Not on file  . Ran Out of Food in the Last Year: Not on file  Transportation Needs:   . Lack of Transportation (Medical): Not on file  . Lack of Transportation (Non-Medical): Not on file  Physical Activity:   . Days of Exercise per Week: Not on file  . Minutes of Exercise per Session: Not on file  Stress:   . Feeling of Stress : Not on file  Social Connections:   . Frequency of Communication with Friends and Family: Not on file  . Frequency of Social Gatherings with Friends and Family: Not on file  . Attends Religious Services: Not on file  . Active Member of Clubs or Organizations: Not on file  . Attends Archivist Meetings: Not on file  . Marital Status: Not on file  Intimate Partner Violence:   . Fear of Current or Ex-Partner: Not on file  .  Emotionally Abused: Not on file  . Physically Abused: Not on file  . Sexually Abused: Not on file      PHYSICAL EXAM  Vitals:   11/10/19 0730  BP: 132/71  Pulse: 86  Temp: (!) 97.3 F (36.3 C)  TempSrc: Oral  Weight: 218 lb 9.6 oz (99.2 kg)  Height: 5\' 7"  (1.702 m)   Body mass index is 34.24 kg/m.  Generalized: Well developed, in no acute distress  Cardiology: normal rate and rhythm, no murmur noted Respiratory: Clear to auscultation bilaterally Neurological examination  Mentation: Alert oriented to time, place, history taking. Follows all commands speech and language fluent Cranial nerve II-XII: Pupils were equal round reactive to light. Extraocular movements were full, visual field were full on confrontational test.  Motor: The motor testing reveals 5 over 5 strength of all 4 extremities. Good symmetric motor tone is noted throughout.  Gait and station: Gait is normal.    DIAGNOSTIC DATA (LABS, IMAGING, TESTING) - I reviewed patient records, labs, notes, testing and imaging myself where available.  No flowsheet data found.   Lab Results  Component Value Date   WBC 10.1 12/27/2015   HGB 16.1 (H) 12/27/2015   HCT 48.6 (H) 12/27/2015   MCV 85.4 12/27/2015   PLT 219 12/27/2015      Component Value Date/Time   NA 141 12/27/2015 0004   K 3.2 (L) 12/27/2015 0004   CL 102 12/27/2015 0004   CO2 24 12/27/2015 0004   GLUCOSE 128 (H) 12/27/2015 0004   BUN 7 12/27/2015 0004   CREATININE 0.96 12/27/2015 0004   CALCIUM 9.6 12/27/2015 0004   PROT 6.8 12/27/2015 0004   ALBUMIN 4.1 12/27/2015 0004   AST 28 12/27/2015 0004   ALT 23 12/27/2015 0004   ALKPHOS 69 12/27/2015 0004   BILITOT 0.6 12/27/2015 0004   GFRNONAA >60 12/27/2015 0004   GFRAA >60 12/27/2015 0004  No results found for: CHOL, HDL, LDLCALC, LDLDIRECT, TRIG, CHOLHDL No results found for: QMVH8I No results found for: VITAMINB12 No results found for: TSH     ASSESSMENT AND PLAN 48 y.o. year old  female  has a past medical history of Anxiety, Diabetes mellitus without complication (HCC), Dyslipidemia, GERD (gastroesophageal reflux disease), Hypertension, PONV (postoperative nausea and vomiting), and Von Willebrand disease (HCC). here with     ICD-10-CM   1. OSA on CPAP  G47.33    Z99.89     Kaysea has responded well to the increase water pressure from 14 cm to 15 cm of water.  Residual AHI is now 7.7.  Average pressure 13.9.  We will increase maximum pressure to 16 cm of water.  We have discussed need for compliance with 4-hour goal.  She was encouraged to use CPAP nightly.  She will call with any concerns prior to her follow-up with me in 3 months.  She verbalizes understanding and agreement with this plan.   No orders of the defined types were placed in this encounter.    No orders of the defined types were placed in this encounter.     I spent 15 minutes with the patient. 50% of this time was spent counseling and educating patient on plan of care and medications.    Shawnie Dapper, FNP-C 11/10/2019, 7:35 AM [facility] [address], [address] [place], [place] [address] [phone]  I reviewed the above note and documentation by the Nurse Practitioner and agree with the history, exam, assessment and plan as outlined above. I was available for consultation. Huston Foley, MD, PhD [facility])

## 2019-11-09 NOTE — Patient Instructions (Addendum)
Continue CPAP therapy nightly and for greater than 4 hours each night  Follow-up in 3 months   CPAP and BPAP Information CPAP and BPAP are methods of helping a person breathe with the use of air pressure. CPAP stands for "continuous positive airway pressure." BPAP stands for "bi-level positive airway pressure." In both methods, air is blown through your nose or mouth and into your air passages to help you breathe well. CPAP and BPAP use different amounts of pressure to blow air. With CPAP, the amount of pressure stays the same while you breathe in and out. With BPAP, the amount of pressure is increased when you breathe in (inhale) so that you can take larger breaths. Your health care provider will recommend whether CPAP or BPAP would be more helpful for you. Why are CPAP and BPAP treatments used? CPAP or BPAP can be helpful if you have:  Sleep apnea.  Chronic obstructive pulmonary disease (COPD).  Heart failure.  Medical conditions that weaken the muscles of the chest including muscular dystrophy, or neurological diseases such as amyotrophic lateral sclerosis (ALS).  Other problems that cause breathing to be weak, abnormal, or difficult. CPAP is most commonly used for obstructive sleep apnea (OSA) to keep the airways from collapsing when the muscles relax during sleep. How is CPAP or BPAP administered? Both CPAP and BPAP are provided by a small machine with a flexible plastic tube that attaches to a plastic mask. You wear the mask. Air is blown through the mask into your nose or mouth. The amount of pressure that is used to blow the air can be adjusted on the machine. Your health care provider will determine the pressure setting that should be used based on your individual needs. When should CPAP or BPAP be used? In most cases, the mask only needs to be worn during sleep. Generally, the mask needs to be worn throughout the night and during any daytime naps. People with certain medical  conditions may also need to wear the mask at other times when they are awake. Follow instructions from your health care provider about when to use the machine. What are some tips for using the mask?   Because the mask needs to be snug, some people feel trapped or closed-in (claustrophobic) when first using the mask. If you feel this way, you may need to get used to the mask. One way to do this is by holding the mask loosely over your nose or mouth and then gradually applying the mask more snugly. You can also gradually increase the amount of time that you use the mask.  Masks are available in various types and sizes. Some fit over your mouth and nose while others fit over just your nose. If your mask does not fit well, talk with your health care provider about getting a different one.  If you are using a mask that fits over your nose and you tend to breathe through your mouth, a chin strap may be applied to help keep your mouth closed.  The CPAP and BPAP machines have alarms that may sound if the mask comes off or develops a leak.  If you have trouble with the mask, it is very important that you talk with your health care provider about finding a way to make the mask easier to tolerate. Do not stop using the mask. Stopping the use of the mask could have a negative impact on your health. What are some tips for using the machine?  Place your  CPAP or BPAP machine on a secure table or stand near an electrical outlet.  Know where the on/off switch is located on the machine.  Follow instructions from your health care provider about how to set the pressure on your machine and when you should use it.  Do not eat or drink while the CPAP or BPAP machine is on. Food or fluids could get pushed into your lungs by the pressure of the CPAP or BPAP.  Do not smoke. Tobacco smoke residue can damage the machine.  For home use, CPAP and BPAP machines can be rented or purchased through home health care  companies. Many different brands of machines are available. Renting a machine before purchasing may help you find out which particular machine works well for you.  Keep the CPAP or BPAP machine and attachments clean. Ask your health care provider for specific instructions. Get help right away if:  You have redness or open areas around your nose or mouth where the mask fits.  You have trouble using the CPAP or BPAP machine.  You cannot tolerate wearing the CPAP or BPAP mask.  You have pain, discomfort, and bloating in your abdomen. Summary  CPAP and BPAP are methods of helping a person breathe with the use of air pressure.  Both CPAP and BPAP are provided by a small machine with a flexible plastic tube that attaches to a plastic mask.  If you have trouble with the mask, it is very important that you talk with your health care provider about finding a way to make the mask easier to tolerate. This information is not intended to replace advice given to you by your health care provider. Make sure you discuss any questions you have with your health care provider. Document Released: 08/04/2004 Document Revised: 02/26/2019 Document Reviewed: 09/25/2016 Elsevier Patient Education  2020 [employer].

## 2019-11-10 ENCOUNTER — Ambulatory Visit: Payer: 59 | Admitting: Family Medicine

## 2019-11-10 ENCOUNTER — Encounter: Payer: Self-pay | Admitting: Family Medicine

## 2019-11-10 ENCOUNTER — Other Ambulatory Visit: Payer: Self-pay

## 2019-11-10 VITALS — BP 132/71 | HR 86 | Temp 97.3°F | Ht 67.0 in | Wt 218.6 lb

## 2019-11-10 DIAGNOSIS — G4733 Obstructive sleep apnea (adult) (pediatric): Secondary | ICD-10-CM | POA: Diagnosis not present

## 2019-11-10 DIAGNOSIS — Z9989 Dependence on other enabling machines and devices: Secondary | ICD-10-CM

## 2020-02-09 ENCOUNTER — Other Ambulatory Visit: Payer: Self-pay

## 2020-02-09 ENCOUNTER — Ambulatory Visit (INDEPENDENT_AMBULATORY_CARE_PROVIDER_SITE_OTHER): Payer: 59 | Admitting: Family Medicine

## 2020-02-09 ENCOUNTER — Encounter: Payer: Self-pay | Admitting: Family Medicine

## 2020-02-09 DIAGNOSIS — Z9989 Dependence on other enabling machines and devices: Secondary | ICD-10-CM

## 2020-02-09 DIAGNOSIS — G4733 Obstructive sleep apnea (adult) (pediatric): Secondary | ICD-10-CM | POA: Diagnosis not present

## 2020-02-09 NOTE — Progress Notes (Addendum)
PATIENT: Donna Freeman DOB: 09-Jul-1971  REASON FOR VISIT: follow up HISTORY FROM: patient  Virtual Visit via Telephone Note  I connected with Donna Freeman on 02/09/20 at  8:30 AM EDT by telephone and verified that I am speaking with the correct person using two identifiers.   I discussed the limitations, risks, security and privacy concerns of performing an evaluation and management service by telephone and the availability of in person appointments. I also discussed with the patient that there may be a patient responsible charge related to this service. The patient expressed understanding and agreed to proceed.   History of Present Illness:  02/09/20 Donna Freeman is a 49 y.o. female here today for follow up for severe OSA on CPAP. HST revealed total AHI of 68.5 with O2 nadir of 78% in 06/2019. She has continued CPAP therapy. She continues to struggle with meeting 4 hour compliance recommendation. She reports having difficulty with seasonal allergies over the past few weeks. She was tested for COVID but, fortunately, was negative. She remains motivated to continue using CPAP nightly and will continue to shoot for 4 hour goal.   Compliance report dated 01/10/2020 through 02/08/2020 reveals that she used CPAP 22 of the last 30 nights for compliance of 73%.  She used CPAP 8 days for greater than 4 hours for 27%.  Average usage on days used was 3 hours and 57 minutes.  Residual AHI was 7.1 on 7 to 15 cm of water and an EPR of 3.  There was no significant leak noted.  Pressures in the 95th percentile of 13.6.  History (copied from my note on 11/10/2019)  Donna Freeman is a 49 y.o. female here today for follow up of OSA on CPAP.  She was recently seen for compliance review.  Despite excellent compliance, her residual AHI remained elevated at 11.1.  We increased her maximum pressure from 14 to 15 cm of water pressure.  She has tolerated this increase very well.  She continues to use CPAP  nightly.  She does report some trouble with completing her 4-hour goal.  She contributes this to restless sleep and increased stress as she is caring for her mother who is ill.  She does note benefit of using CPAP nightly.  Compliance report dated 10/07/2019 through 11/05/2019 reveals that she use CPAP 30 of the last 30 nights for compliance of 90%.  20 of the last 30 nights she used CPAP greater than 4 hours for compliance of 67%.  Average usage was 5 hours and 16 minutes.  Residual AHI is 7.7 on 7 to 15 cm water and an EPR of 3.  Pressure in the 95th percentile at 13.9 with maximum pressure 14.4 there was no significant leak noted.  HISTORY: (copied from my note on 09/24/2019)  Donna Freeman a 49 y.o.femalehere today for follow up of OSA on CPAP. Home study showed severe OSA with AHI 68.5 and O2 nadir of 78%. This is her initial compliance visit.She reports that she is doing fairly well on CPAP therapy. She is continuing to adjust to her mask. She is currently using a fullface mask. She feels that there is a little bit of air that leaks from the bridge of her nose. She definitely notes improvement in sleep and daytime energy since starting CPAP therapy.  Compliance report dated 08/23/2019 through 09/21/2019 reveals that she has used CPAP 29 of the last 30 days for compliance of 97%. 21 days she used CPAP  greater than 4 hours for compliance of 70%. Average usage was 5 hours and 9 minutes. AHI was 11.1 on 7 to 14 cm of water and an EPR of 3. Pressure in the 95th percentile of 13.3. There was no significant leak noted.  HISTORY: (copied fromDr Athar'snote on 06/17/2019)  Dear Donna Freeman,   I saw your patient, Donna Freeman, upon your kind request in my sleep clinic today for initial consultation of her sleep disorder, in particular, concern for underlying obstructive sleep apnea. The patient is unaccompanied today. As you know, Donna. Freeman is a 49 year old right-handed woman with an  underlying medical history of hypertension,Smoking,hyperlipidemia, reflux disease, anxiety, diabetes, von Willebrand disease, and obesity, who reports snoring and excessive daytime somnolence. I reviewed your office note from 06/10/2019, which you kindly included. Her Epworth sleepiness score is 19 out of 24, fatigue severity score is 57 out of 63. She has had increasing daytime somnolence for the past year. She lives alone, has been divorced for over 20 years, no kids. Has 1 dog and 2 cats in the household, 1 cat and her dog are typically on one side of the bed. She is not bothered by the pets in her bedroom, she does have the TV on at night primarily for light, she tries to muted but often falls asleep after the news with the volume still on. She has nocturia about 2 times per average night, sometimes 3. She has woken up occasionally with a headache. She has a family history of sleep apnea in 1 of her first cousins. Her father died young in an accident at age 31, her mom is 12 years old and has significant medical issues, and patient reports quite a bit of stress in dealing with her mom's illness. She is an only child. She denies any restless legs type symptoms. She drinks caffeine in the form of coffee, 2 cups/day on average and 1 bottle of soda, 16 ounce per day, occasional tea. She drinks alcohol at least 5 days out of the week, typically in the form of hard seltzer or a mixed drink. She smokes about a pack per day. Bedtime is around 10 PM and rise time between 630 and 7 AM. She works as an Optometrist. She had a tonsillectomy as a teenager in college. She has woken up with her own snoring or feeling of gasping for air. She has woken up with her mouth dry.    Observations/Objective:  Generalized: Well developed, in no acute distress  Mentation: Alert oriented to time, place, history taking. Follows all commands speech and language fluent   Assessment and Plan:  49 y.o. year old  female  has a past medical history of Anxiety, Diabetes mellitus without complication ([place]), Dyslipidemia, GERD (gastroesophageal reflux disease), Hypertension, PONV (postoperative nausea and vomiting), and Von Willebrand disease ([place]). here with    ICD-10-CM   1. OSA on CPAP  G47.33    Z99.89    Donna Freeman continues to have difficulty meeting the four hour compliance goal. She feels that chronic allergies have contributed to decreased compliance since last being seen. She denies difficulty with CPAP or supplies. She was encouraged to continue working toward goal of nightly use and minimum of 4 hour usage each night. AHI remains elevated but pressure settings are appropriate at this time. We will continue to monitor. She will follow up in 3 months, sooner if needed. She verbalizes understanding and agreement with this plan.    No orders of the defined types  were placed in this encounter.   No orders of the defined types were placed in this encounter.    Follow Up Instructions:  I discussed the assessment and treatment plan with the patient. The patient was provided an opportunity to ask questions and all were answered. The patient agreed with the plan and demonstrated an understanding of the instructions.   The patient was advised to call back or seek an in-person evaluation if the symptoms worsen or if the condition fails to improve as anticipated.  I provided 20 minutes of non-face-to-face time during this encounter. Patient is located at her place of residence during televisit. Provider is in the office.    Shawnie Dapper, NP   I reviewed the above note and documentation by the Nurse Practitioner and agree with the history, exam, assessment and plan as outlined above. I was available for consultation. Huston Foley, MD, PhD [facility])

## 2020-04-26 ENCOUNTER — Emergency Department (HOSPITAL_COMMUNITY): Payer: 59

## 2020-04-26 ENCOUNTER — Emergency Department (HOSPITAL_COMMUNITY)
Admission: EM | Admit: 2020-04-26 | Discharge: 2020-04-26 | Disposition: A | Discharge status: Home / Self Care | Payer: 59 | Attending: Emergency Medicine | Admitting: Emergency Medicine

## 2020-04-26 ENCOUNTER — Other Ambulatory Visit: Payer: Self-pay

## 2020-04-26 ENCOUNTER — Encounter (HOSPITAL_COMMUNITY): Payer: Self-pay

## 2020-04-26 DIAGNOSIS — R109 Unspecified abdominal pain: Secondary | ICD-10-CM

## 2020-04-26 DIAGNOSIS — Z793 Long term (current) use of hormonal contraceptives: Secondary | ICD-10-CM | POA: Insufficient documentation

## 2020-04-26 DIAGNOSIS — Z79899 Other long term (current) drug therapy: Secondary | ICD-10-CM | POA: Insufficient documentation

## 2020-04-26 DIAGNOSIS — E119 Type 2 diabetes mellitus without complications: Secondary | ICD-10-CM | POA: Diagnosis not present

## 2020-04-26 DIAGNOSIS — F17210 Nicotine dependence, cigarettes, uncomplicated: Secondary | ICD-10-CM | POA: Diagnosis not present

## 2020-04-26 DIAGNOSIS — I10 Essential (primary) hypertension: Secondary | ICD-10-CM | POA: Insufficient documentation

## 2020-04-26 LAB — COMPREHENSIVE METABOLIC PANEL
ALT: 19 U/L (ref 0–44)
AST: 26 U/L (ref 15–41)
Albumin: 4.5 g/dL (ref 3.5–5.0)
Alkaline Phosphatase: 54 U/L (ref 38–126)
Anion gap: 13 (ref 5–15)
BUN: 9 mg/dL (ref 6–20)
CO2: 26 mmol/L (ref 22–32)
Calcium: 9.4 mg/dL (ref 8.9–10.3)
Chloride: 99 mmol/L (ref 98–111)
Creatinine, Ser: 0.92 mg/dL (ref 0.44–1.00)
GFR calc Af Amer: >60 mL/min (ref >60)
GFR calc non Af Amer: >60 mL/min (ref >60)
Glucose, Bld: 110 mg/dL — ABNORMAL HIGH (ref 70–99)
Potassium: 3.7 mmol/L (ref 3.5–5.1)
Sodium: 138 mmol/L (ref 135–145)
Total Bilirubin: 0.9 mg/dL (ref 0.3–1.2)
Total Protein: 7.8 g/dL (ref 6.5–8.1)

## 2020-04-26 LAB — URINALYSIS, ROUTINE W REFLEX MICROSCOPIC
Bilirubin Urine: NEGATIVE
Glucose, UA: NEGATIVE mg/dL
Ketones, ur: NEGATIVE mg/dL
Leukocytes,Ua: NEGATIVE
Nitrite: NEGATIVE
Protein, ur: NEGATIVE mg/dL
Specific Gravity, Urine: 1.004 — ABNORMAL LOW (ref 1.005–1.030)
pH: 7 (ref 5.0–8.0)

## 2020-04-26 LAB — CBC WITH DIFFERENTIAL/PLATELET
Abs Immature Granulocytes: 0.03 10*3/uL (ref 0.00–0.07)
Basophils Absolute: 0.1 10*3/uL (ref 0.0–0.1)
Basophils Relative: 1 %
Eosinophils Absolute: 0.2 10*3/uL (ref 0.0–0.5)
Eosinophils Relative: 2 %
HCT: 48.1 % — ABNORMAL HIGH (ref 36.0–46.0)
Hemoglobin: 15.9 g/dL — ABNORMAL HIGH (ref 12.0–15.0)
Immature Granulocytes: 0 %
Lymphocytes Relative: 28 %
Lymphs Abs: 1.9 10*3/uL (ref 0.7–4.0)
MCH: 28.5 pg (ref 26.0–34.0)
MCHC: 33.1 g/dL (ref 30.0–36.0)
MCV: 86.2 fL (ref 80.0–100.0)
Monocytes Absolute: 0.6 10*3/uL (ref 0.1–1.0)
Monocytes Relative: 9 %
Neutro Abs: 4.1 10*3/uL (ref 1.7–7.7)
Neutrophils Relative %: 60 %
Platelets: 241 10*3/uL (ref 150–400)
RBC: 5.58 MIL/uL — ABNORMAL HIGH (ref 3.87–5.11)
RDW: 15.4 % (ref 11.5–15.5)
WBC: 6.7 10*3/uL (ref 4.0–10.5)
nRBC: 0 % (ref 0.0–0.2)

## 2020-04-26 LAB — POC URINE PREG, ED: Preg Test, Ur: NEGATIVE

## 2020-04-26 LAB — LIPASE, BLOOD: Lipase: 22 U/L (ref 11–51)

## 2020-04-26 MED ORDER — LIDOCAINE 5 % EX PTCH
1.0000 | MEDICATED_PATCH | Freq: Once | CUTANEOUS | Status: DC
  Administered 2020-04-26: 1 via TRANSDERMAL
  Filled 2020-04-26: qty 1

## 2020-04-26 NOTE — ED Provider Notes (Signed)
[facility] DEPT Provider Note   CSN: [id number] Arrival date & time: 04/26/20  1047     History Chief Complaint  Patient presents with  . Flank Pain  . Nausea    Donna Freeman is a 49 y.o. female with past medical history significant for dyslipidemia, hypertension, von Willebrand disease presenting to emergency department today with chief complaint of flank pain x2 days.  She states it started yesterday evening.  Pain is located in her left flank without radiation.  Pain is worse with movement.  She states at rest she has pain 4/10 in severity and with movement pain is 8/10. She reports associated nausea without emesis.  She has not taken any medications for symptoms prior to arrival.  She denies history of kidney stones.  She also denies fever, chills, cough, hemoptysis, chest pain, shortness of breath, abdominal pain, pelvic pain, vaginal discharge, abnormal vaginal bleeding, urinary frequency, dysuria, gross hematuria.  She does admit to diarrhea however has a daily since her cholecystectomy, she reports no change in bowel movements.    Past Medical History:  Diagnosis Date  . Anxiety   . Diabetes mellitus without complication [facility])    patient states she has never had diabetes  . Dyslipidemia   . GERD (gastroesophageal reflux disease)   . Hypertension   . PONV (postoperative nausea and vomiting)    hx of strep kidney that left scars   . Von Willebrand disease [facility])     Patient Active Problem List   Diagnosis Date Noted  . OSA on CPAP 02/09/2020    Past Surgical History:  Procedure Laterality Date  . LAPAROSCOPIC CHOLECYSTECTOMY SINGLE SITE WITH INTRAOPERATIVE CHOLANGIOGRAM N/A 12/30/2015   Procedure: LAPAROSCOPIC CHOLECYSTECTOMY SINGLE SITE WITH INTRAOPERATIVE CHOLANGIOGRAM;  Surgeon: Michael Boston, MD;  Location: [facility] ORS;  Service: General;  Laterality: N/A;  . MOUTH SURGERY    . TONSILLECTOMY     and adenoidectomy      OB History   No  obstetric history on file.     Family History  Problem Relation Age of Onset  . Heart attack Mother     Social History   Tobacco Use  . Smoking status: Current Every Day Smoker    Packs/day: 1.00    Years: 35.00    Pack years: 35.00    Types: Cigarettes  . Smokeless tobacco: Never Used  Substance Use Topics  . Alcohol use: Yes    Comment: 2 glasses of wine or beer every other day   . Drug use: No    Home Medications Prior to Admission medications   Medication Sig Start Date End Date Taking? Authorizing Provider  cetirizine (ZYRTEC) 10 MG tablet Take 10 mg by mouth daily.    [provider]  citalopram (CELEXA) 20 MG tablet Take 20 mg by mouth daily.    [provider]  etonogestrel (IMPLANON) 68 MG IMPL implant Inject 1 each into the skin once. Placed 12/07/2014    [provider]  losartan-hydrochlorothiazide (HYZAAR) 50-12.5 MG tablet Take 1 tablet by mouth daily.    [provider]  omeprazole (PRILOSEC) 20 MG capsule Take 20 mg by mouth daily.    [provider]    Allergies    Shellfish allergy, Other, and Phenergan [promethazine hcl]  Review of Systems   Review of Systems All other systems are reviewed and are negative for acute change except as noted in the HPI.  Physical Exam Updated Vital Signs BP 109/62  Pulse 63   Temp 98.9 F (37.2 C) (Oral)   Resp 16   Ht 5\' 8"  (1.727 m)   Wt 97.5 kg   SpO2 98%   BMI 32.69 kg/m   Physical Exam Vitals and nursing note reviewed.  Constitutional:      General: She is not in acute distress.    Appearance: She is not ill-appearing.  HENT:     Head: Normocephalic and atraumatic.     Right Ear: Tympanic membrane and external ear normal.     Left Ear: Tympanic membrane and external ear normal.     Nose: Nose normal.     Mouth/Throat:     Mouth: Mucous membranes are moist.     Pharynx: Oropharynx is clear.  Eyes:     General: No scleral icterus.       Right eye: No  discharge.        Left eye: No discharge.     Extraocular Movements: Extraocular movements intact.     Conjunctiva/sclera: Conjunctivae normal.     Pupils: Pupils are equal, round, and reactive to light.  Neck:     Vascular: No JVD.  Cardiovascular:     Rate and Rhythm: Normal rate and regular rhythm.     Pulses: Normal pulses.          Radial pulses are 2+ on the right side and 2+ on the left side.     Heart sounds: Normal heart sounds.  Pulmonary:     Comments: Lungs clear to auscultation in all fields. Symmetric chest rise. No wheezing, rales, or rhonchi. Abdominal:     General: Bowel sounds are normal.     Tenderness: There is right CVA tenderness.     Comments: Abdomen is soft, non-distended, and non-tender in all quadrants. No rigidity, no guarding. No peritoneal signs.  Musculoskeletal:        General: Normal range of motion.     Cervical back: Normal range of motion.  Skin:    General: Skin is warm and dry.     Capillary Refill: Capillary refill takes less than 2 seconds.  Neurological:     Mental Status: She is oriented to person, place, and time.     GCS: GCS eye subscore is 4. GCS verbal subscore is 5. GCS motor subscore is 6.     Comments: Fluent speech, no facial droop.  Psychiatric:        Behavior: Behavior normal.     ED Results / Procedures / Treatments   Labs (all labs ordered are listed, but only abnormal results are displayed) Labs Reviewed  URINALYSIS, ROUTINE W REFLEX MICROSCOPIC - Abnormal; Notable for the following components:      Result Value   Color, Urine STRAW (*)    Specific Gravity, Urine 1.004 (*)    Hgb urine dipstick SMALL (*)    Bacteria, UA RARE (*)    All other components within normal limits  COMPREHENSIVE METABOLIC PANEL - Abnormal; Notable for the following components:   Glucose, Bld 110 (*)    All other components within normal limits  CBC WITH DIFFERENTIAL/PLATELET - Abnormal; Notable for the following components:   RBC 5.58  (*)    Hemoglobin 15.9 (*)    HCT 48.1 (*)    All other components within normal limits  URINE CULTURE  LIPASE, BLOOD  POC URINE PREG, ED    EKG None  Radiology CT ABDOMEN AND PELVIS WITHOUT CONTRAST  TECHNIQUE: Multidetector CT imaging of the  abdomen and pelvis was performed following the standard protocol without IV contrast.  COMPARISON: None.  FINDINGS: Lower chest: No acute abnormality.  Hepatobiliary: No focal liver abnormality is seen. Status post cholecystectomy. No biliary dilatation.  Pancreas: Unremarkable.  Spleen: Unremarkable.  Adrenals/Urinary Tract: Adrenals, kidneys, and bladder are unremarkable.  Stomach/Bowel: Stomach is within normal limits. Bowel is normal in caliber. Colonic wall fat deposition, which may reflect chronic inflammation.  Vascular/Lymphatic: Minimal aortic atherosclerosis.  Reproductive: Uterus and bilateral adnexa are unremarkable.  Other: There is no ascites. Abdominal wall is unremarkable.  Musculoskeletal: Mild lower lumbar spine degenerative changes.  IMPRESSION: No acute abnormality or findings to account for reported symptoms.   Electronically Signed By: Guadlupe Spanish M.D. On: 04/26/2020 12:59  Procedures Procedures (including critical care time)  Medications Ordered in ED Medications  lidocaine (LIDODERM) 5 % 1 patch (has no administration in time range)    ED Course  I have reviewed the triage vital signs and the nursing notes.  Pertinent labs & imaging results that were available during my care of the patient were reviewed by me and considered in my medical decision making (see chart for details).    MDM Rules/Calculators/A&P                      History provided by patient with additional history obtained from chart review.    Patient seen and examined. Patient presents awake, alert, hemodynamically stable, afebrile, non toxic.  No tachycardia or hypoxia.  On exam she has no abdominal tenderness,  no peritoneal signs.  She does have left CVA tenderness.  DDx includes kidney stone, MSK strain, less likely to be cardiac related given history and presentation. Labs were collected in triage.  I viewed results.  She has no leukocytosis, no anemia, no severe electrolyte derangement, no renal insufficiency, no transaminitis.  Lipase is within normal range.  UA shows small hemoglobinuria, no RBC, no signs of urinary tract infection.  CT renal stone study shows no kidney stone and kidneys are normal in appearance.  Discussed findings with patient.  Given lidocaine patch for pain.  Recommend she follow-up with PCP to have repeat UA given mild hemoglobinuria findings today.  The patient appears reasonably screened and/or stabilized for discharge and I doubt any other medical condition or other [facility] requiring further screening, evaluation, or treatment in the ED at this time prior to discharge. The patient is safe for discharge with strict return precautions discussed.  Portions of this note were generated with Scientist, clinical (histocompatibility and immunogenetics). Dictation errors may occur despite best attempts at proofreading.   Final Clinical Impression(s) / ED Diagnoses Final diagnoses:  Flank pain    Rx / DC Orders ED Discharge Orders    None       Kathyrn Lass 04/26/20 1359    Lorre Nick, MD 05/03/20 [phone]

## 2020-04-26 NOTE — Discharge Instructions (Addendum)
You have been seen today for left flank pain. Please read and follow all provided instructions. Return to the emergency room for worsening condition or new concerning symptoms.    Your CT scan today do not show a kidney stone and your kidneys look normal. Your urine did not show any infection.  1. Medications:  You can take Tylenol or ibuprofen as needed for pain.  Take as directed on the bottle. Also try over-the-counter creams or ointments for pain such as icy hot as we discussed.  Continue usual home medications Take medications as prescribed. Please review all of the medicines and only take them if you do not have an allergy to them.   2. Treatment: rest, drink plenty of fluids  3. Follow Up:  Please follow up with primary care provider by scheduling an appointment as soon as possible for a visit.   -Your urine sample today showed blood.  I recommend you follow-up with your primary care doctor about this to have a repeat urine sample performed.  No emergent interventions are necessary at this time   It is also a possibility that you have an allergic reaction to any of the medicines that you have been prescribed - Everybody reacts differently to medications and while MOST people have no trouble with most medicines, you may have a reaction such as nausea, vomiting, rash, swelling, shortness of breath. If this is the case, please stop taking the medicine immediately and contact your physician.  ?

## 2020-04-26 NOTE — ED Triage Notes (Signed)
Patient c/o left flank pain since yesterday. Patient also reports cloudy urine and nausea.

## 2020-04-27 LAB — URINE CULTURE

## 2020-05-05 ENCOUNTER — Other Ambulatory Visit: Payer: Self-pay

## 2020-05-05 ENCOUNTER — Emergency Department (HOSPITAL_COMMUNITY)
Admission: EM | Admit: 2020-05-05 | Discharge: 2020-05-05 | Disposition: A | Discharge status: Home / Self Care | Payer: 59 | Attending: Emergency Medicine | Admitting: Emergency Medicine

## 2020-05-05 ENCOUNTER — Encounter (HOSPITAL_COMMUNITY): Payer: Self-pay | Admitting: Emergency Medicine

## 2020-05-05 DIAGNOSIS — Y9389 Activity, other specified: Secondary | ICD-10-CM | POA: Diagnosis not present

## 2020-05-05 DIAGNOSIS — S61250A Open bite of right index finger without damage to nail, initial encounter: Secondary | ICD-10-CM | POA: Insufficient documentation

## 2020-05-05 DIAGNOSIS — Z23 Encounter for immunization: Secondary | ICD-10-CM | POA: Insufficient documentation

## 2020-05-05 DIAGNOSIS — Z2914 Encounter for prophylactic rabies immune globin: Secondary | ICD-10-CM | POA: Insufficient documentation

## 2020-05-05 DIAGNOSIS — Z7984 Long term (current) use of oral hypoglycemic drugs: Secondary | ICD-10-CM | POA: Diagnosis not present

## 2020-05-05 DIAGNOSIS — I10 Essential (primary) hypertension: Secondary | ICD-10-CM | POA: Insufficient documentation

## 2020-05-05 DIAGNOSIS — Y9289 Other specified places as the place of occurrence of the external cause: Secondary | ICD-10-CM | POA: Diagnosis not present

## 2020-05-05 DIAGNOSIS — W5501XA Bitten by cat, initial encounter: Secondary | ICD-10-CM | POA: Insufficient documentation

## 2020-05-05 DIAGNOSIS — E119 Type 2 diabetes mellitus without complications: Secondary | ICD-10-CM | POA: Insufficient documentation

## 2020-05-05 DIAGNOSIS — Y998 Other external cause status: Secondary | ICD-10-CM | POA: Diagnosis not present

## 2020-05-05 DIAGNOSIS — F17210 Nicotine dependence, cigarettes, uncomplicated: Secondary | ICD-10-CM | POA: Diagnosis not present

## 2020-05-05 LAB — I-STAT BETA HCG BLOOD, ED (MC, WL, AP ONLY): I-stat hCG, quantitative: <5 m[IU]/mL (ref <5)

## 2020-05-05 MED ORDER — ACETAMINOPHEN 325 MG PO TABS
650.0000 mg | ORAL_TABLET | Freq: Once | ORAL | Status: AC
  Administered 2020-05-05: 650 mg via ORAL
  Filled 2020-05-05: qty 2

## 2020-05-05 MED ORDER — ACETAMINOPHEN ER 650 MG PO TBCR: 650.0000 mg | EXTENDED_RELEASE_TABLET | Freq: Three times a day (TID) | ORAL | 0 refills | Status: AC | PRN

## 2020-05-05 MED ORDER — AMOXICILLIN-POT CLAVULANATE 875-125 MG PO TABS: 1.0000 | ORAL_TABLET | Freq: Two times a day (BID) | ORAL | 0 refills | Status: DC

## 2020-05-05 MED ORDER — TETANUS-DIPHTH-ACELL PERTUSSIS 5-2.5-18.5 LF-MCG/0.5 IM SUSP
0.5000 mL | Freq: Once | INTRAMUSCULAR | Status: AC
  Administered 2020-05-05: 0.5 mL via INTRAMUSCULAR
  Filled 2020-05-05: qty 0.5

## 2020-05-05 MED ORDER — RABIES IMMUNE GLOBULIN 150 UNIT/ML IM INJ
20.0000 [IU]/kg | INJECTION | Freq: Once | INTRAMUSCULAR | Status: AC
  Administered 2020-05-05: 2025 [IU] via INTRAMUSCULAR
  Filled 2020-05-05: qty 13.5

## 2020-05-05 MED ORDER — ACETAMINOPHEN ER 650 MG PO TBCR: 650.0000 mg | EXTENDED_RELEASE_TABLET | Freq: Three times a day (TID) | ORAL | 0 refills | Status: DC | PRN

## 2020-05-05 MED ORDER — RABIES VACCINE, PCEC IM SUSR
1.0000 mL | Freq: Once | INTRAMUSCULAR | Status: AC
  Administered 2020-05-05: 1 mL via INTRAMUSCULAR
  Filled 2020-05-05: qty 1

## 2020-05-05 MED ORDER — AMOXICILLIN-POT CLAVULANATE 875-125 MG PO TABS
1.0000 | ORAL_TABLET | Freq: Once | ORAL | Status: AC
  Administered 2020-05-05: 1 via ORAL
  Filled 2020-05-05: qty 1

## 2020-05-05 NOTE — ED Triage Notes (Signed)
Patient states she was bitten by a stray cat today at approx 545 PM. Pt has 6 puncture wounds on R index finger. Last tetanus shot approx 20 years ago

## 2020-05-05 NOTE — ED Provider Notes (Signed)
[facility]-EMERGENCY DEPT Provider Note   CSN: [id number] Arrival date & time: 05/05/20  1607     History Chief Complaint  Patient presents with  . Animal Bite    JEANI FASSNACHT is a 49 y.o. female with a past medical history significant for anxiety, diabetes, hyperlipidemia, hypertension, GERD, and von Willebrand disease who presents to the ED after a cat bite to her right index finger. Patient states a stray cat latched onto her right index finger causing 6 superficial puncture wounds. After the bite, patient cleaned wound thoroughly with soap and water and peroxide. Patient admits to mild pain to right index finger, worse with movement. No drainage from site. Patient's last tetanus shot was approximately 20 years ago. No pain treatment prior to arrival.  History obtained from patient and past medical records. No interpreter used during encounter.       Past Medical History:  Diagnosis Date  . Anxiety   . Diabetes mellitus without complication [facility])    patient states she has never had diabetes  . Dyslipidemia   . GERD (gastroesophageal reflux disease)   . Hypertension   . PONV (postoperative nausea and vomiting)    hx of strep kidney that left scars   . Von Willebrand disease [facility])     Patient Active Problem List   Diagnosis Date Noted  . OSA on CPAP 02/09/2020    Past Surgical History:  Procedure Laterality Date  . LAPAROSCOPIC CHOLECYSTECTOMY SINGLE SITE WITH INTRAOPERATIVE CHOLANGIOGRAM N/A 12/30/2015   Procedure: LAPAROSCOPIC CHOLECYSTECTOMY SINGLE SITE WITH INTRAOPERATIVE CHOLANGIOGRAM;  Surgeon: Karie Soda, MD;  Location: [facility] ORS;  Service: General;  Laterality: N/A;  . MOUTH SURGERY    . TONSILLECTOMY     and adenoidectomy      OB History   No obstetric history on file.     Family History  Problem Relation Age of Onset  . Heart attack Mother     Social History   Tobacco Use  . Smoking status: Current Every Day Smoker     Packs/day: 1.00    Years: 35.00    Pack years: 35.00    Types: Cigarettes  . Smokeless tobacco: Never Used  Vaping Use  . Vaping Use: Never used  Substance Use Topics  . Alcohol use: Yes    Comment: 2 glasses of wine or beer every other day   . Drug use: No    Home Medications Prior to Admission medications   Medication Sig Start Date End Date Taking? Authorizing Provider  acetaminophen (TYLENOL 8 HOUR) 650 MG CR tablet Take 1 tablet (650 mg total) by mouth every 8 (eight) hours as needed for pain. 05/05/20   Mannie Stabile, PA-C  amoxicillin-clavulanate (AUGMENTIN) 875-125 MG tablet Take 1 tablet by mouth every 12 (twelve) hours for 7 days. 05/05/20 05/12/20  Mannie Stabile, PA-C  cetirizine (ZYRTEC) 10 MG tablet Take 10 mg by mouth daily.    [provider]  citalopram (CELEXA) 20 MG tablet Take 20 mg by mouth daily.    [provider]  etonogestrel (IMPLANON) 68 MG IMPL implant Inject 1 each into the skin once. Placed 12/07/2014    [provider]  losartan-hydrochlorothiazide (HYZAAR) 50-12.5 MG tablet Take 1 tablet by mouth daily.    [provider]  omeprazole (PRILOSEC) 20 MG capsule Take 20 mg by mouth daily.    [provider]    Allergies    Shellfish allergy, Other, and Phenergan [promethazine hcl]  Review of Systems   Review of Systems  Constitutional: Negative for chills and fever.  Musculoskeletal: Negative for joint swelling.  Skin: Positive for color change and wound.  Neurological: Negative for numbness.    Physical Exam Updated Vital Signs BP 109/70   Pulse 82   Temp 99.6 F (37.6 C) (Oral)   Resp 18   Ht 5\' 7"  (1.702 m)   Wt 101 kg   SpO2 99%   BMI 34.86 kg/m   Physical Exam Vitals and nursing note reviewed.  Constitutional:      General: She is not in acute distress.    Appearance: She is not ill-appearing.  HENT:     Head: Normocephalic.  Eyes:     Pupils: Pupils are equal, round, and  reactive to light.  Cardiovascular:     Rate and Rhythm: Normal rate and regular rhythm.     Pulses: Normal pulses.     Heart sounds: Normal heart sounds. No murmur heard.  No friction rub. No gallop.   Pulmonary:     Effort: Pulmonary effort is normal.     Breath sounds: Normal breath sounds.  Abdominal:     General: Abdomen is flat. There is no distension.     Palpations: Abdomen is soft.     Tenderness: There is no abdominal tenderness. There is no guarding or rebound.  Musculoskeletal:     Cervical back: Neck supple.     Comments: Tenderness palpation throughout right index finger with mild edema. Limited range of motion of right index finger due to pain. Radial pulse intact. Soft compartments. Full range of motion of all other fingers. No snuffbox tenderness. Full range of motion of right wrist.  Skin:    General: Skin is warm and dry.     Comments: Superficial puncture wounds to right index finger. Mild erythema surrounding puncture wounds. No drainage. See photos below.  Neurological:     General: No focal deficit present.     Mental Status: She is alert.  Psychiatric:        Mood and Affect: Mood normal.        Behavior: Behavior normal.     ED Results / Procedures / Treatments   Labs (all labs ordered are listed, but only abnormal results are displayed) Labs Reviewed  I-STAT BETA HCG BLOOD, ED ([facility], [facility], [facility] ONLY)    EKG None  Radiology No results found.  Procedures Procedures (including critical care time)  Medications Ordered in ED Medications  Tdap (BOOSTRIX) injection 0.5 mL (has no administration in time range)  amoxicillin-clavulanate (AUGMENTIN) 875-125 MG per tablet 1 tablet (has no administration in time range)  acetaminophen (TYLENOL) tablet 650 mg (has no administration in time range)  rabies immune globulin (HYPERAB/KEDRAB) injection 2,025 Units (has no administration in time range)  rabies vaccine (RABAVERT) injection 1 mL (has no administration in  time range)    ED Course  I have reviewed the triage vital signs and the nursing notes.  Pertinent labs & imaging results that were available during my care of the patient were reviewed by me and considered in my medical decision making (see chart for details).    MDM Rules/Calculators/A&P                         49 year old female presents to the ED after a stray cat bite that occurred just prior to arrival. Last tetanus shot was roughly 20 years ago. No meds. Patient no  acute distress and non-ill-appearing. Numerous superficial puncture wounds to right index finger with mild surrounding erythema. Slight decreased range of motion of right index finger due to pain. No concern for septic arthritis at this time. Wounds irrigated well with 18ga angiocath with sterile saline.  Wounds examined with visualization of the base and no foreign bodies seen.  Pt Alert and oriented, NAD, nontoxic, nonseptic appearing.  Capillary refill intact and pt without neurologic deficit. No x-ray warranted at this time given superficial wounds. Patient tetanus updated.  Patient rabies vaccine and immunoglobulin risk and benefit discussed.  Pt consents. Pain treated in the emergency department. Wounds not closed secondary to concern for infection. We'll discharge home with pain medication, Augmentin and requests for close follow-up with PCP or back in the ER. Rabies vaccine schedule given to patient at discharge. Strict ED precautions discussed with patient. Patient states understanding and agrees to plan. Patient discharged home in no acute distress and stable vitals  Final Clinical Impression(s) / ED Diagnoses Final diagnoses:  Cat bite, initial encounter    Rx / DC Orders ED Discharge Orders         Ordered    amoxicillin-clavulanate (AUGMENTIN) 875-125 MG tablet  Every 12 hours     Discontinue  Reprint     05/05/20 2016    acetaminophen (TYLENOL 8 HOUR) 650 MG CR tablet  Every 8 hours PRN     Discontinue  Reprint      05/05/20 2016           Jesusita Oka 05/05/20 2122    Charlynne Pander, MD 05/05/20 2326

## 2020-05-05 NOTE — Discharge Instructions (Addendum)
                                  RABIES VACCINE FOLLOW UP  Patient's Name: Donna Freeman                     Original Order Date:05/05/2020  Medical Record Number: [id number]  ED Physician: Charlynne Pander, MD Primary Diagnosis: Rabies Exposure       PCP: Gaspar Garbe, MD  Patient Phone Number: (home) [phone] (home)    (cell)  Telephone Information:  Mobile [phone]    (work) There is no work phone number on file. Species of Animal:     You have been seen in the Emergency Department for a possible rabies exposure. It's very important you return for the additional vaccine doses.  Please call the clinic listed below for hours of operation.   Clinic that will administer your rabies vaccines:    DAY 0:  05/05/2020      DAY 3:  05/08/2020       DAY 7:  05/12/2020     DAY 14:  05/19/2020         The 5th vaccine injection is considered for immune compromised patients only.  DAY 28:  06/02/2020      Above is the rabies vaccine schedule. Please report to the ER or urgent care for your rabies vaccine. I'm sending home with antibiotic. Take as prescribed and finish all antibiotics. I am also sending home with Tylenol as needed for pain. Continue to keep area clean. Please follow-up with PCP within the next week for further evaluation. Return to the ER for new or worsening symptoms.

## 2020-05-06 ENCOUNTER — Ambulatory Visit (HOSPITAL_COMMUNITY): Payer: Self-pay

## 2020-05-06 ENCOUNTER — Ambulatory Visit (HOSPITAL_COMMUNITY)
Admission: EM | Admit: 2020-05-06 | Discharge: 2020-05-06 | Disposition: A | Discharge status: Home / Self Care | Payer: 59 | Attending: Urgent Care | Admitting: Urgent Care

## 2020-05-06 ENCOUNTER — Encounter (HOSPITAL_COMMUNITY): Payer: Self-pay | Admitting: Emergency Medicine

## 2020-05-06 ENCOUNTER — Other Ambulatory Visit: Payer: Self-pay

## 2020-05-06 DIAGNOSIS — W5501XA Bitten by cat, initial encounter: Secondary | ICD-10-CM | POA: Diagnosis not present

## 2020-05-06 DIAGNOSIS — S61459A Open bite of unspecified hand, initial encounter: Secondary | ICD-10-CM | POA: Diagnosis not present

## 2020-05-06 DIAGNOSIS — L03011 Cellulitis of right finger: Secondary | ICD-10-CM

## 2020-05-06 DIAGNOSIS — L03113 Cellulitis of right upper limb: Secondary | ICD-10-CM

## 2020-05-06 DIAGNOSIS — L089 Local infection of the skin and subcutaneous tissue, unspecified: Secondary | ICD-10-CM

## 2020-05-06 MED ORDER — CLINDAMYCIN HCL 300 MG PO CAPS: 300.0000 mg | ORAL_CAPSULE | Freq: Three times a day (TID) | ORAL | 0 refills | Status: DC

## 2020-05-06 MED ORDER — CEFTRIAXONE SODIUM 1 G IJ SOLR
1.0000 g | Freq: Once | INTRAMUSCULAR | Status: AC
  Administered 2020-05-06: 1 g via INTRAMUSCULAR

## 2020-05-06 MED ORDER — CEFTRIAXONE SODIUM 1 G IJ SOLR
INTRAMUSCULAR | Status: AC
  Filled 2020-05-06: qty 10

## 2020-05-06 MED ORDER — FLUCONAZOLE 150 MG PO TABS
150.0000 mg | ORAL_TABLET | ORAL | 0 refills | Status: AC
Start: 2020-05-06 — End: ?

## 2020-05-06 MED ORDER — DOXYCYCLINE HYCLATE 100 MG PO CAPS: 100.0000 mg | ORAL_CAPSULE | Freq: Two times a day (BID) | ORAL | 0 refills | Status: DC

## 2020-05-06 NOTE — ED Provider Notes (Signed)
[facility]   MRN: [id number] DOB: Jun 15, 1971  Subjective:   Donna Freeman is a 49 y.o. female presenting for recheck on left index finger infection from a feral cat bite.  Wound was suffered yesterday, was seen and treated at [facility].  Started on Augmentin and has since gotten worse including developing erythema, worsening swelling extending into her hand.  She is not able to take anti-inflammatories due to having von Willebrand's disease.  No current facility-administered medications for this encounter.  Current Outpatient Medications:  .  acetaminophen (TYLENOL 8 HOUR) 650 MG CR tablet, Take 1 tablet (650 mg total) by mouth every 8 (eight) hours as needed for pain., Disp: 20 tablet, Rfl: 0 .  amoxicillin-clavulanate (AUGMENTIN) 875-125 MG tablet, Take 1 tablet by mouth every 12 (twelve) hours for 7 days., Disp: 14 tablet, Rfl: 0 .  cetirizine (ZYRTEC) 10 MG tablet, Take 10 mg by mouth daily., Disp: , Rfl:  .  citalopram (CELEXA) 20 MG tablet, Take 20 mg by mouth daily., Disp: , Rfl:  .  etonogestrel (IMPLANON) 68 MG IMPL implant, Inject 1 each into the skin once. Placed 12/07/2014, Disp: , Rfl:  .  losartan-hydrochlorothiazide (HYZAAR) 50-12.5 MG tablet, Take 1 tablet by mouth daily., Disp: , Rfl:  .  omeprazole (PRILOSEC) 20 MG capsule, Take 20 mg by mouth daily., Disp: , Rfl:    Allergies  Allergen Reactions  . Shellfish Allergy Anaphylaxis  . Other Itching    BANANAS- causes mouth itching and diarrhea  . Phenergan [Promethazine Hcl]     Vomiting, rash    Past Medical History:  Diagnosis Date  . Anxiety   . Diabetes mellitus without complication [facility])    patient states she has never had diabetes  . Dyslipidemia   . GERD (gastroesophageal reflux disease)   . Hypertension   . PONV (postoperative nausea and vomiting)    hx of strep kidney that left scars   . Von Willebrand disease (HCC)      Past Surgical History:  Procedure Laterality Date  .  LAPAROSCOPIC CHOLECYSTECTOMY SINGLE SITE WITH INTRAOPERATIVE CHOLANGIOGRAM N/A 12/30/2015   Procedure: LAPAROSCOPIC CHOLECYSTECTOMY SINGLE SITE WITH INTRAOPERATIVE CHOLANGIOGRAM;  Surgeon: Karie Soda, MD;  Location: [facility];  Service: General;  Laterality: N/A;  . MOUTH SURGERY    . TONSILLECTOMY     and adenoidectomy     Family History  Problem Relation Age of Onset  . Heart attack Mother     Social History   Tobacco Use  . Smoking status: Current Every Day Smoker    Packs/day: 1.00    Years: 35.00    Pack years: 35.00    Types: Cigarettes  . Smokeless tobacco: Never Used  Vaping Use  . Vaping Use: Never used  Substance Use Topics  . Alcohol use: Yes    Comment: 2 glasses of wine or beer every other day   . Drug use: No    ROS   Objective:   Vitals: BP 125/64 (BP Location: Left Arm)   Pulse 83   Temp 98.2 F (36.8 C) (Oral)   Resp 16   SpO2 100%   Physical Exam Constitutional:      General: She is not in acute distress.    Appearance: Normal appearance. She is well-developed. She is not ill-appearing.  HENT:     Head: Normocephalic and atraumatic.     Nose: Nose normal.     Mouth/Throat:     Mouth: Mucous membranes are moist.  Pharynx: Oropharynx is clear.  Eyes:     General: No scleral icterus.    Extraocular Movements: Extraocular movements intact.     Pupils: Pupils are equal, round, and reactive to light.  Cardiovascular:     Rate and Rhythm: Normal rate.  Pulmonary:     Effort: Pulmonary effort is normal.  Musculoskeletal:     Right hand: Swelling and tenderness present. No deformity, lacerations or bony tenderness. Decreased range of motion. Normal capillary refill.       Hands:  Skin:    General: Skin is warm and dry.  Neurological:     General: No focal deficit present.     Mental Status: She is alert and oriented to person, place, and time.  Psychiatric:        Mood and Affect: Mood normal.        Behavior: Behavior normal.         Thought Content: Thought content normal.        Judgment: Judgment normal.     Results for orders placed or performed during the hospital encounter of 05/05/20 (from the past 24 hour(s))  I-Stat beta hCG blood, ED     Status: None   Collection Time: 05/05/20  8:28 PM  Result Value Ref Range   I-stat hCG, quantitative <5.0 <5 mIU/mL   Comment 3                   Assessment and Plan :   PDMP not reviewed this encounter.  1. Cellulitis of index finger, right   2. Cellulitis of right hand   3. Infected animal bite of hand, initial encounter   4. Cat bite, initial encounter     Case discussed with Dr. Valere Dross.  Patient given IM ceftriaxone in clinic, start doxycycline and stop Augmentin.  Follow-up with hand specialist, Dr. Amedeo Plenty, tomorrow.  Hand placed in static finger splint. Counseled patient on potential for adverse effects with medications prescribed/recommended today, ER and return-to-clinic precautions discussed, patient verbalized understanding.    Donna Freeman, [place] 05/06/20 1906

## 2020-05-06 NOTE — ED Triage Notes (Signed)
Patient presents to urgent care today with symptoms of ferile cat bite [place]. Symptoms began on Yesterday. Pt states right hand is red and swollen and hot to the touch. She is worried about infection.

## 2020-05-08 ENCOUNTER — Ambulatory Visit (HOSPITAL_COMMUNITY)
Admission: RE | Admit: 2020-05-08 | Discharge: 2020-05-08 | Disposition: A | Discharge status: Home / Self Care | Payer: 59 | Source: Ambulatory Visit | Attending: Family Medicine | Admitting: Family Medicine

## 2020-05-08 ENCOUNTER — Other Ambulatory Visit: Payer: Self-pay

## 2020-05-08 DIAGNOSIS — Z203 Contact with and (suspected) exposure to rabies: Secondary | ICD-10-CM

## 2020-05-08 MED ORDER — RABIES VACCINE, PCEC IM SUSR
INTRAMUSCULAR | Status: AC
  Filled 2020-05-08: qty 1

## 2020-05-08 MED ORDER — RABIES VACCINE, PCEC IM SUSR
1.0000 mL | Freq: Once | INTRAMUSCULAR | Status: AC
  Administered 2020-05-08: 1 mL via INTRAMUSCULAR

## 2020-05-08 NOTE — ED Triage Notes (Signed)
Pt is here to receive her rabies injection.

## 2020-05-12 ENCOUNTER — Other Ambulatory Visit: Payer: Self-pay

## 2020-05-12 ENCOUNTER — Ambulatory Visit (HOSPITAL_COMMUNITY)
Admission: RE | Admit: 2020-05-12 | Discharge: 2020-05-12 | Disposition: A | Discharge status: Home / Self Care | Payer: 59 | Source: Ambulatory Visit | Attending: Internal Medicine | Admitting: Internal Medicine

## 2020-05-12 DIAGNOSIS — Z203 Contact with and (suspected) exposure to rabies: Secondary | ICD-10-CM | POA: Diagnosis not present

## 2020-05-12 MED ORDER — RABIES VACCINE, PCEC IM SUSR
INTRAMUSCULAR | Status: AC
  Filled 2020-05-12: qty 1

## 2020-05-12 MED ORDER — RABIES VACCINE, PCEC IM SUSR
1.0000 mL | Freq: Once | INTRAMUSCULAR | Status: AC
  Administered 2020-05-12: 1 mL via INTRAMUSCULAR

## 2020-05-17 NOTE — Progress Notes (Addendum)
PATIENT: Donna Freeman DOB: 04-18-1971  REASON FOR VISIT: follow up HISTORY FROM: patient  Virtual Visit via Telephone Note  I connected with Donna Freeman on 05/18/20 at  7:30 AM EDT by telephone and verified that I am speaking with the correct person using two identifiers.   I discussed the limitations, risks, security and privacy concerns of performing an evaluation and management service by telephone and the availability of in person appointments. I also discussed with the patient that there may be a patient responsible charge related to this service. The patient expressed understanding and agreed to proceed.   History of Present Illness:  05/18/20 Donna Freeman is a 49 y.o. female here today for follow up for OSA on CPAP. She reports that compliance has been off due to recent rabies exposure. She picked up a kitten in the woods and was bitten on the hand. She has been on antibiotics and reports regualr vomiting episodes. She is followed closely bu PCP. She receives her last rabies treatment tomorrow. Otherwise, she has been doing well with CPAP therapy. She does continue to struggle with 4 hour compliance. She usually places therapy every night but has not been able to adjust to wearing it all night. She does feel better when using CPAP.   Compliance report dated 04/17/2020 through 05/16/2020 reveals that she used CPAP therapy 25 of the last 30 days for compliance of 83%.  She used CPAP greater than 4 hours 10 of the last 30 days for compliance of 33%.  Average usage was 3 hours and 30 minutes.  Residual AHI was 5.1 on 7 to 15 cm of water and an EPR of 3.  There was no significant leak noted.   History (copied from my note on 02/09/2020)  Donna Freeman is a 50 y.o. female here today for follow up for severe OSA on CPAP. HST revealed total AHI of 68.5 with O2 nadir of 78% in 06/2019. She has continued CPAP therapy. She continues to struggle with meeting 4 hour compliance  recommendation. She reports having difficulty with seasonal allergies over the past few weeks. She was tested for COVID but, fortunately, was negative. She remains motivated to continue using CPAP nightly and will continue to shoot for 4 hour goal.   Compliance report dated 01/10/2020 through 02/08/2020 reveals that she used CPAP 22 of the last 30 nights for compliance of 73%.  She used CPAP 8 days for greater than 4 hours for 27%.  Average usage on days used was 3 hours and 57 minutes.  Residual AHI was 7.1 on 7 to 15 cm of water and an EPR of 3.  There was no significant leak noted.  Pressures in the 95th percentile of 13.6.  History (copied from my note on 11/10/2019)  Donna Freeman a 49 y.o.femalehere today for follow up of OSA on CPAP.She was recently seen for compliance review. Despite excellent compliance, herresidual AHI remained elevated at 11.1. We increased her maximum pressure from 14 to 15 cm of water pressure. She has tolerated this increase very well. She continues to use CPAP nightly. She does report some trouble with completing her 4-hour goal. She contributes this to restless sleep and increased stress as she is caring for her mother who is ill. She does note benefit of using CPAP nightly.  Compliance report dated 10/07/2019 through 11/05/2019 reveals that she use CPAP 30 of the last 30 nights for compliance of 90%. 20 of the last 30 nights  she used CPAP greater than 4 hours for compliance of 67%. Average usage was 5 hours and 16 minutes. Residual AHI is 7.7 on 7 to 15 cm water and an EPR of 3. Pressure in the 95th percentile at 13.9 with maximum pressure 14.4 there was no significant leak noted.  HISTORY: (copied frommynote on 09/24/2019)  Donna Freeman a 49 y.o.femalehere today for follow up of OSA on CPAP. Home study showed severe OSA with AHI 68.5 and O2 nadir of 78%. This is her initial compliance visit.She reports that she is doing fairly well on  CPAP therapy. She is continuing to adjust to her mask. She is currently using a fullface mask. She feels that there is a little bit of air that leaks from the bridge of her nose. She definitely notes improvement in sleep and daytime energy since starting CPAP therapy.  Compliance report dated 08/23/2019 through 09/21/2019 reveals that she has used CPAP 29 of the last 30 days for compliance of 97%. 21 days she used CPAP greater than 4 hours for compliance of 70%. Average usage was 5 hours and 9 minutes. AHI was 11.1 on 7 to 14 cm of water and an EPR of 3. Pressure in the 95th percentile of 13.3. There was no significant leak noted.  HISTORY: (copied fromDr Athar'snote on 06/17/2019)  Dear Donna Freeman,   I saw your patient, Donna Freeman, upon your kind request in my sleep clinic today for initial consultation of her sleep disorder, in particular, concern for underlying obstructive sleep apnea. The patient is unaccompanied today. As you know, Ms. Teaney is a 49 year old right-handed woman with an underlying medical history of hypertension,Smoking,hyperlipidemia, reflux disease, anxiety, diabetes, von Willebrand disease, and obesity, who reports snoring and excessive daytime somnolence. I reviewed your office note from 06/10/2019, which you kindly included. Her Epworth sleepiness score is 19 out of 24, fatigue severity score is 57 out of 63. She has had increasing daytime somnolence for the past year. She lives alone, has been divorced for over 20 years, no kids. Has 1 dog and 2 cats in the household, 1 cat and her dog are typically on one side of the bed. She is not bothered by the pets in her bedroom, she does have the TV on at night primarily for light, she tries to muted but often falls asleep after the news with the volume still on. She has nocturia about 2 times per average night, sometimes 3. She has woken up occasionally with a headache. She has a family history of sleep apnea in 1 of her  first cousins. Her father died young in an accident at age 40, her mom is 65 years old and has significant medical issues, and patient reports quite a bit of stress in dealing with her mom's illness. She is an only child. She denies any restless legs type symptoms. She drinks caffeine in the form of coffee, 2 cups/day on average and 1 bottle of soda, 16 ounce per day, occasional tea. She drinks alcohol at least 5 days out of the week, typically in the form of hard seltzer or a mixed drink. She smokes about a pack per day. Bedtime is around 10 PM and rise time between 630 and 7 AM. She works as an Optometrist. She had a tonsillectomy as a teenager in college. She has woken up with her own snoring or feeling of gasping for air. She has woken up with her mouth dry.   Observations/Objective:  Televisit  Mentation: Alert oriented  to time, place, history taking. Follows all commands speech and language fluent   Assessment and Plan:  49 y.o. year old female  has a past medical history of Anxiety, Diabetes mellitus without complication (HCC), Dyslipidemia, GERD (gastroesophageal reflux disease), Hypertension, PONV (postoperative nausea and vomiting), and Von Willebrand disease (HCC). here with    ICD-10-CM   1. OSA on CPAP  G47.33    Z99.89     Donna Freeman has been unable to use CPAP therapy over the past 10 days due to illness from rabies exposure.  She is doing better and completes rabies treatment tomorrow.  She wishes to resume CPAP therapy.  She does note improvement in sleep quality and daytime energy levels with CPAP usage.  We have reviewed risk of untreated sleep apnea.  I have encouraged her to use CPAP nightly and for greater than 4 hours each night.  She will continue close follow-up with primary care as well.  We will follow-up in 3 months to review compliance report.  She verbalizes understanding and agreement with this plan.  No orders of the defined types were placed in this  encounter.   No orders of the defined types were placed in this encounter.    Follow Up Instructions:  I discussed the assessment and treatment plan with the patient. The patient was provided an opportunity to ask questions and all were answered. The patient agreed with the plan and demonstrated an understanding of the instructions.   The patient was advised to call back or seek an in-person evaluation if the symptoms worsen or if the condition fails to improve as anticipated.  I provided 15 minutes of non-face-to-face time during this encounter. Patient is located at her place of residence during televisit.  Provider is in the office.   Shawnie Dapper, NP   I reviewed the above note and documentation by the Nurse Practitioner and agree with the history, exam, assessment and plan as outlined above. I was available for consultation. Huston Foley, MD, PhD [facility])

## 2020-05-18 ENCOUNTER — Ambulatory Visit (INDEPENDENT_AMBULATORY_CARE_PROVIDER_SITE_OTHER): Payer: 59 | Admitting: Family Medicine

## 2020-05-18 ENCOUNTER — Other Ambulatory Visit: Payer: Self-pay

## 2020-05-18 ENCOUNTER — Encounter: Payer: Self-pay | Admitting: Family Medicine

## 2020-05-18 DIAGNOSIS — G4733 Obstructive sleep apnea (adult) (pediatric): Secondary | ICD-10-CM

## 2020-05-18 DIAGNOSIS — Z9989 Dependence on other enabling machines and devices: Secondary | ICD-10-CM

## 2020-05-19 ENCOUNTER — Other Ambulatory Visit: Payer: Self-pay

## 2020-05-19 ENCOUNTER — Ambulatory Visit (HOSPITAL_COMMUNITY)
Admission: RE | Admit: 2020-05-19 | Discharge: 2020-05-19 | Disposition: A | Discharge status: Home / Self Care | Payer: 59 | Source: Ambulatory Visit | Attending: Internal Medicine | Admitting: Internal Medicine

## 2020-05-19 ENCOUNTER — Encounter (HOSPITAL_COMMUNITY): Payer: Self-pay

## 2020-05-19 DIAGNOSIS — Z203 Contact with and (suspected) exposure to rabies: Secondary | ICD-10-CM | POA: Diagnosis not present

## 2020-05-19 MED ORDER — RABIES VACCINE, PCEC IM SUSR
INTRAMUSCULAR | Status: AC
  Filled 2020-05-19: qty 1

## 2020-05-19 MED ORDER — RABIES VACCINE, PCEC IM SUSR
1.0000 mL | Freq: Once | INTRAMUSCULAR | Status: AC
  Administered 2020-05-19: 1 mL via INTRAMUSCULAR

## 2020-05-19 NOTE — ED Triage Notes (Signed)
Pt here for 4th rabies; given in left arm

## 2020-06-01 ENCOUNTER — Other Ambulatory Visit: Payer: Self-pay

## 2020-06-01 ENCOUNTER — Encounter (HOSPITAL_COMMUNITY): Payer: Self-pay

## 2020-06-01 ENCOUNTER — Ambulatory Visit (HOSPITAL_COMMUNITY)
Admission: RE | Admit: 2020-06-01 | Discharge: 2020-06-01 | Disposition: A | Discharge status: Home / Self Care | Payer: 59 | Source: Ambulatory Visit | Attending: Physician Assistant | Admitting: Physician Assistant

## 2020-06-01 VITALS — BP 140/82 | HR 80 | Temp 98.8°F | Resp 16

## 2020-06-01 DIAGNOSIS — W5501XD Bitten by cat, subsequent encounter: Secondary | ICD-10-CM | POA: Diagnosis not present

## 2020-06-01 DIAGNOSIS — M7989 Other specified soft tissue disorders: Secondary | ICD-10-CM | POA: Diagnosis not present

## 2020-06-01 NOTE — ED Notes (Signed)
Notified ortho tech ?

## 2020-06-01 NOTE — Progress Notes (Signed)
Orthopedic Tech Progress Note Patient Details:  Donna Freeman [birth year] [id number]  Ortho Devices Type of Ortho Device: Rad Gutter splint Ortho Device/Splint Location: RUE Ortho Device/Splint Interventions: Application, Ordered   Post Interventions Patient Tolerated: Well Instructions Provided: Care of device, Poper ambulation with device   Dystany Duffy 06/01/2020, 9:12 PM

## 2020-06-01 NOTE — ED Triage Notes (Signed)
Pt here for f/u of swollen finger post kitten bite approx one month ago. Pt has completed antibiotics and rabies vaccines. Denies new injury.

## 2020-06-01 NOTE — ED Provider Notes (Signed)
[facility]    CSN: [id number] Arrival date & time: 06/01/20  1746      History   Chief Complaint Chief Complaint  Patient presents with  . Finger Injury    HPI Donna Freeman is a 49 y.o. female.   Patient reports for evaluation of finger swelling.  She reports about a month ago she was bitten by a kitten causing infection.  She was treated for this infection followed by hand specialist.  She reports the swelling was in the right first digit and on the back of the hand initially.  She reports she had a follow-up on 05/14/2020 with a hand specialist and given clearance and to complete her antibiotics.  She reports she completed these in early July.  She reports since then she has had some increasing in swelling and tightness in the finger.  Mild pain reported in the joint of the finger.  She reports her coworkers noticed this over the last few days and this is what prompted her to come in.  She has not followed back up with a hand specialist.  She has not had fevers or chills.  Patient saw Dr. Lovie Macadamia with [facility].     Past Medical History:  Diagnosis Date  . Anxiety   . Dyslipidemia   . GERD (gastroesophageal reflux disease)   . Hypertension   . PONV (postoperative nausea and vomiting)    hx of strep kidney that left scars   . Von Willebrand disease [facility])     Patient Active Problem List   Diagnosis Date Noted  . OSA on CPAP 02/09/2020    Past Surgical History:  Procedure Laterality Date  . CHOLECYSTECTOMY    . LAPAROSCOPIC CHOLECYSTECTOMY SINGLE SITE WITH INTRAOPERATIVE CHOLANGIOGRAM N/A 12/30/2015   Procedure: LAPAROSCOPIC CHOLECYSTECTOMY SINGLE SITE WITH INTRAOPERATIVE CHOLANGIOGRAM;  Surgeon: Karie Soda, MD;  Location: [facility];  Service: General;  Laterality: N/A;  . MOUTH SURGERY    . TONSILLECTOMY     and adenoidectomy     OB History   No obstetric history on file.      Home Medications    Prior to Admission medications   Medication Sig  Start Date End Date Taking? Authorizing Provider  cetirizine (ZYRTEC) 10 MG tablet Take 10 mg by mouth daily.   Yes [provider]  citalopram (CELEXA) 20 MG tablet Take 20 mg by mouth daily.   Yes [provider]  losartan-hydrochlorothiazide (HYZAAR) 50-12.5 MG tablet Take 1 tablet by mouth daily.   Yes [provider]  omeprazole (PRILOSEC) 20 MG capsule Take 20 mg by mouth daily.   Yes [provider]  acetaminophen (TYLENOL 8 HOUR) 650 MG CR tablet Take 1 tablet (650 mg total) by mouth every 8 (eight) hours as needed for pain. 05/05/20   Mannie Stabile, PA-C  doxycycline (VIBRAMYCIN) 100 MG capsule Take 1 capsule (100 mg total) by mouth 2 (two) times daily. 05/06/20   Wallis Bamberg, PA-C  etonogestrel (IMPLANON) 68 MG IMPL implant Inject 1 each into the skin once. Placed 12/07/2014    [provider]  fluconazole (DIFLUCAN) 150 MG tablet Take 1 tablet (150 mg total) by mouth once a week. 05/06/20   Wallis Bamberg, PA-C    Family History Family History  Problem Relation Age of Onset  . Heart attack Mother     Social History Social History   Tobacco Use  . Smoking status: Current Every Day Smoker    Packs/day: 1.00  Years: 35.00    Pack years: 35.00    Types: Cigarettes  . Smokeless tobacco: Never Used  Vaping Use  . Vaping Use: Never used  Substance Use Topics  . Alcohol use: Yes    Comment: 2 glasses of wine or beer every other day   . Drug use: No     Allergies   Shellfish allergy, Other, and Phenergan [promethazine hcl]   Review of Systems Review of Systems   Physical Exam Triage Vital Signs ED Triage Vitals [06/01/20 1818]  Enc Vitals Group     BP      Pulse      Resp      Temp      Temp src      SpO2      Weight      Height      Head Circumference      Peak Flow      Pain Score 2     Pain Loc      Pain Edu?      Excl. in GC?    No data found.  Updated Vital Signs BP 140/82 (BP Location: Right Arm)    Pulse 80   Temp 98.8 F (37.1 C) (Oral)   Resp 16   SpO2 96%   Visual Acuity Right Eye Distance:   Left Eye Distance:   Bilateral Distance:    Right Eye Near:   Left Eye Near:    Bilateral Near:     Physical Exam Vitals and nursing note reviewed.  Musculoskeletal:     Comments: Right first digit with mild swelling when compared to left.  Some decreased range of motion with flexion due to tightness.  Some tenderness in the PIP.  Joint is not warm.  No significant erythema.      UC Treatments / Results  Labs (all labs ordered are listed, but only abnormal results are displayed) Labs Reviewed - No data to display  EKG   Radiology No results found.  Procedures Procedures (including critical care time)  Medications Ordered in UC Medications - No data to display  Initial Impression / Assessment and Plan / UC Course  I have reviewed the triage vital signs and the nursing notes.  Pertinent labs & imaging results that were available during my care of the patient were reviewed by me and considered in my medical decision making (see chart for details).     #Cat bite subsequent encounter #Finger swelling Patient is a 49 year old presenting for evaluation of finger swelling after having a cat bite 1 month ago.  Patient was on clindamycin through the beginning of the month.  Not completely convinced of infection today, however we will splint her and have her follow-up with hand.  Likely this is reactive inflammation, however not completely clear.  Splint to prevent spreading of infection.  Instructed patient follow-up with Dr. Orlan Leavens.  Patient verbalized agreement understanding plan of care. -Patient placed in a radial gutter splint to immobilize finger and hand. Final Clinical Impressions(s) / UC Diagnoses   Final diagnoses:  Cat bite, subsequent encounter  Finger swelling     Discharge Instructions     Wear the splint  Call dr. Evaristo Bury office for follow up  ASAP    ED Prescriptions    None     PDMP not reviewed this encounter.   Hermelinda Medicus, PA-C 06/01/20 2108

## 2020-06-01 NOTE — Discharge Instructions (Signed)
Wear the splint  Call dr. Evaristo Bury office for follow up ASAP

## 2020-08-30 NOTE — Progress Notes (Deleted)
PATIENT: Donna Freeman DOB: Jul 15, 1971  REASON FOR VISIT: follow up HISTORY FROM: patient  Virtual Visit via Telephone Note  I connected with Donna Freeman on 08/30/20 at  8:30 AM EDT by telephone and verified that I am speaking with the correct person using two identifiers.   I discussed the limitations, risks, security and privacy concerns of performing an evaluation and management service by telephone and the availability of in person appointments. I also discussed with the patient that there may be a patient responsible charge related to this service. The patient expressed understanding and agreed to proceed.   History of Present Illness:  08/30/20 Donna Freeman is a 49 y.o. female here today for follow up.   History (copied form my note on 05/18/2020)  Donna Freeman is a 49 y.o. female here today for follow up for OSA on CPAP. She reports that compliance has been off due to recent rabies exposure. She picked up a kitten in the woods and was bitten on the hand. She has been on antibiotics and reports regualr vomiting episodes. She is followed closely bu PCP. She receives her last rabies treatment tomorrow. Otherwise, she has been doing well with CPAP therapy. She does continue to struggle with 4 hour compliance. She usually places therapy every night but has not been able to adjust to wearing it all night. She does feel better when using CPAP.   Compliance report dated 04/17/2020 through 05/16/2020 reveals that she used CPAP therapy 25 of the last 30 days for compliance of 83%.  She used CPAP greater than 4 hours 10 of the last 30 days for compliance of 33%.  Average usage was 3 hours and 30 minutes.  Residual AHI was 5.1 on 7 to 15 cm of water and an EPR of 3.  There was no significant leak noted.   History (copied from my note on 02/09/2020)  Donna Freeman a 49 y.o.femalehere today for follow up for severe OSA on CPAP. HST revealed total AHI of 68.5 with O2 nadir  of 78% in 06/2019. She has continued CPAP therapy. She continues to struggle with meeting 4 hour compliance recommendation. She reports having difficulty with seasonal allergies over the past few weeks. She was tested for COVID but, fortunately, was negative. She remains motivated to continue using CPAP nightly and will continue to shoot for 4 hour goal.  Compliance report dated 01/10/2020 through 02/08/2020 reveals that she used CPAP 22 of the last 30 nights for compliance of 73%. She used CPAP 8 days for greater than 4 hours for 27%. Average usage on days used was 3 hours and 57 minutes. Residual AHI was 7.1 on 7 to 15 cm of water and an EPR of 3. There was no significant leak noted. Pressures in the 95th percentile of 13.6.  History (copied from my note on 11/10/2019)  Donna Freeman a 49 y.o.femalehere today for follow up of OSA on CPAP.She was recently seen for compliance review. Despite excellent compliance, herresidual AHI remained elevated at 11.1. We increased her maximum pressure from 14 to 15 cm of water pressure. She has tolerated this increase very well. She continues to use CPAP nightly. She does report some trouble with completing her 4-hour goal. She contributes this to restless sleep and increased stress as she is caring for her mother who is ill. She does note benefit of using CPAP nightly.  Compliance report dated 10/07/2019 through 11/05/2019 reveals that she use CPAP 30 of  the last 30 nights for compliance of 90%. 20 of the last 30 nights she used CPAP greater than 4 hours for compliance of 67%. Average usage was 5 hours and 16 minutes. Residual AHI is 7.7 on 7 to 15 cm water and an EPR of 3. Pressure in the 95th percentile at 13.9 with maximum pressure 14.4 there was no significant leak noted.  HISTORY: (copied frommynote on 09/24/2019)  Donna Freeman a 49 y.o.femalehere today for follow up of OSA on CPAP. Home study showed severe OSA with AHI  68.5 and O2 nadir of 78%. This is her initial compliance visit.She reports that she is doing fairly well on CPAP therapy. She is continuing to adjust to her mask. She is currently using a fullface mask. She feels that there is a little bit of air that leaks from the bridge of her nose. She definitely notes improvement in sleep and daytime energy since starting CPAP therapy.  Compliance report dated 08/23/2019 through 09/21/2019 reveals that she has used CPAP 29 of the last 30 days for compliance of 97%. 21 days she used CPAP greater than 4 hours for compliance of 70%. Average usage was 5 hours and 9 minutes. AHI was 11.1 on 7 to 14 cm of water and an EPR of 3. Pressure in the 95th percentile of 13.3. There was no significant leak noted.  HISTORY: (copied fromDr Athar'snote on 06/17/2019)  Dear Donna Freeman,   I saw your patient, Donna Freeman, upon your kind request in my sleep clinic today for initial consultation of her sleep disorder, in particular, concern for underlying obstructive sleep apnea. The patient is unaccompanied today. As you know, Donna Freeman is a 49 year old right-handed woman with an underlying medical history of hypertension,Smoking,hyperlipidemia, reflux disease, anxiety, diabetes, von Willebrand disease, and obesity, who reports snoring and excessive daytime somnolence. I reviewed your office note from 06/10/2019, which you kindly included. Her Epworth sleepiness score is 19 out of 24, fatigue severity score is 57 out of 63. She has had increasing daytime somnolence for the past year. She lives alone, has been divorced for over 20 years, no kids. Has 1 dog and 2 cats in the household, 1 cat and her dog are typically on one side of the bed. She is not bothered by the pets in her bedroom, she does have the TV on at night primarily for light, she tries to muted but often falls asleep after the news with the volume still on. She has nocturia about 2 times per average night,  sometimes 3. She has woken up occasionally with a headache. She has a family history of sleep apnea in 1 of her first cousins. Her father died young in an accident at age 33, her mom is 14 years old and has significant medical issues, and patient reports quite a bit of stress in dealing with her mom's illness. She is an only child. She denies any restless legs type symptoms. She drinks caffeine in the form of coffee, 2 cups/day on average and 1 bottle of soda, 16 ounce per day, occasional tea. She drinks alcohol at least 5 days out of the week, typically in the form of hard seltzer or a mixed drink. She smokes about a pack per day. Bedtime is around 10 PM and rise time between 630 and 7 AM. She works as an Airline pilot. She had a tonsillectomy as a teenager in college. She has woken up with her own snoring or feeling of gasping for air. She has woken  up with her mouth dry.   Observations/Objective:  Generalized: Well developed, in no acute distress  Mentation: Alert oriented to time, place, history taking. Follows all commands speech and language fluent   Assessment and Plan:  49 y.o. year old female  has a past medical history of Anxiety, Dyslipidemia, GERD (gastroesophageal reflux disease), Hypertension, PONV (postoperative nausea and vomiting), and Von Willebrand disease (HCC). here with  No diagnosis found.  No orders of the defined types were placed in this encounter.   No orders of the defined types were placed in this encounter.    Follow Up Instructions:  I discussed the assessment and treatment plan with the patient. The patient was provided an opportunity to ask questions and all were answered. The patient agreed with the plan and demonstrated an understanding of the instructions.   The patient was advised to call back or seek an in-person evaluation if the symptoms worsen or if the condition fails to improve as anticipated.  I provided *** minutes of non-face-to-face  time during this encounter.   Shawnie Dapper, NP

## 2020-08-31 ENCOUNTER — Telehealth: Payer: Self-pay | Admitting: Family Medicine

## 2020-08-31 ENCOUNTER — Telehealth: Payer: 59 | Admitting: Family Medicine

## 2020-08-31 DIAGNOSIS — G4733 Obstructive sleep apnea (adult) (pediatric): Secondary | ICD-10-CM

## 2020-08-31 NOTE — Telephone Encounter (Signed)
I have attempted to reach patient this morning for 830 televisit regarding OSA and CPAP follow-up.  No answer at number provided in chart.  Message left on mobile number.  She may return the call to reschedule appointment.

## 2021-03-21 DIAGNOSIS — I10 Essential (primary) hypertension: Secondary | ICD-10-CM | POA: Diagnosis not present

## 2021-03-21 DIAGNOSIS — R739 Hyperglycemia, unspecified: Secondary | ICD-10-CM | POA: Diagnosis not present

## 2021-03-21 DIAGNOSIS — E559 Vitamin D deficiency, unspecified: Secondary | ICD-10-CM | POA: Diagnosis not present

## 2021-03-28 DIAGNOSIS — Z1331 Encounter for screening for depression: Secondary | ICD-10-CM | POA: Diagnosis not present

## 2021-03-28 DIAGNOSIS — Z0000 Encounter for general adult medical examination without abnormal findings: Secondary | ICD-10-CM | POA: Diagnosis not present

## 2021-03-28 DIAGNOSIS — Z1339 Encounter for screening examination for other mental health and behavioral disorders: Secondary | ICD-10-CM | POA: Diagnosis not present

## 2021-03-28 DIAGNOSIS — I10 Essential (primary) hypertension: Secondary | ICD-10-CM | POA: Diagnosis not present

## 2021-06-02 DIAGNOSIS — H60591 Other noninfective acute otitis externa, right ear: Secondary | ICD-10-CM | POA: Diagnosis not present

## 2021-07-19 IMAGING — CT CT RENAL STONE PROTOCOL
2 of 4 series · 17 of 46 positions shown, 19 images · non-contrast
Comparison: None.

CLINICAL DATA: Left flank pain

EXAM:
CT ABDOMEN AND PELVIS WITHOUT CONTRAST
TECHNIQUE: Multidetector CT imaging of the abdomen and pelvis was performed
following the standard protocol without IV contrast.

[Series 2: axial st · axial · 0.83mm/px · z∈[+1606,+2052]mm · 14 of 101 slices shown, 16 images]
[im 6/101  soft-tissue]
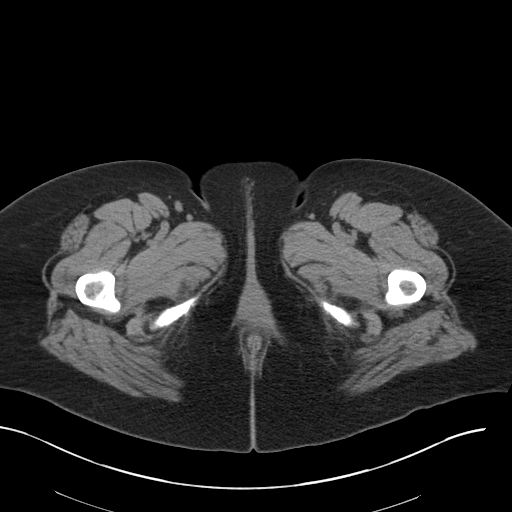
[im 6/101  bone]
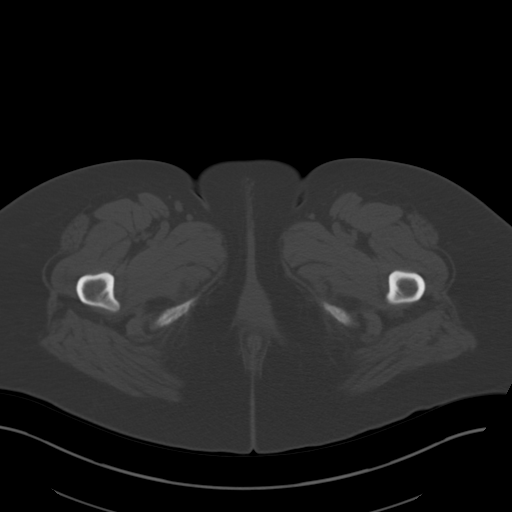
[im 12/101  soft-tissue]
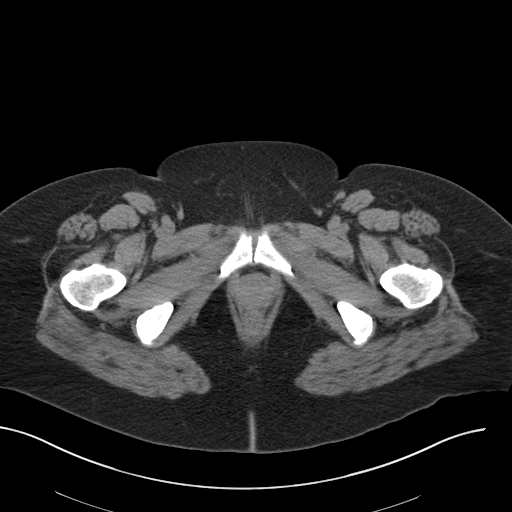
[im 23/101  soft-tissue]
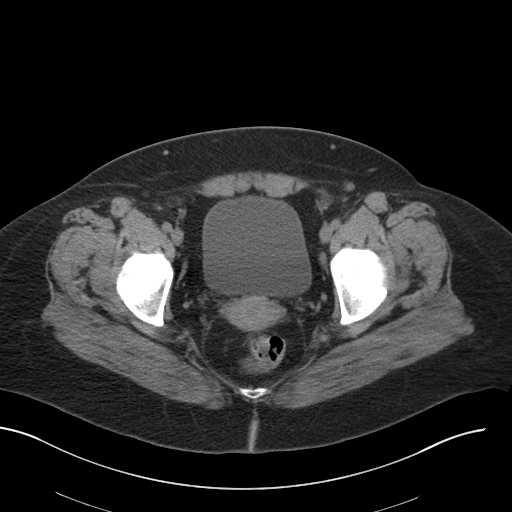
[im 28/101  soft-tissue]
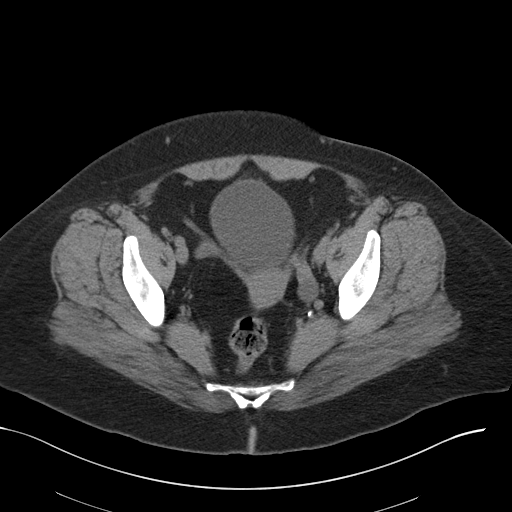
[im 34/101  soft-tissue]
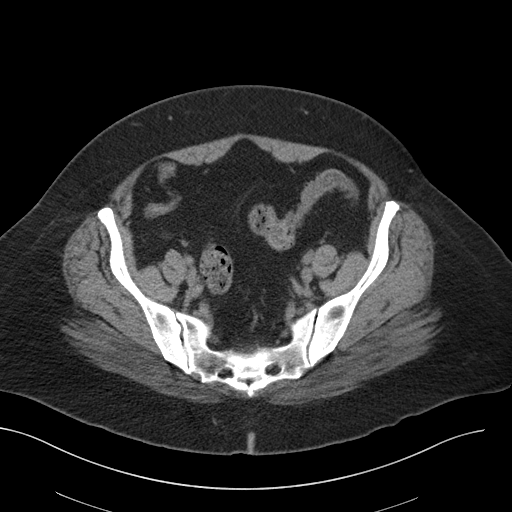
[im 39/101  soft-tissue]
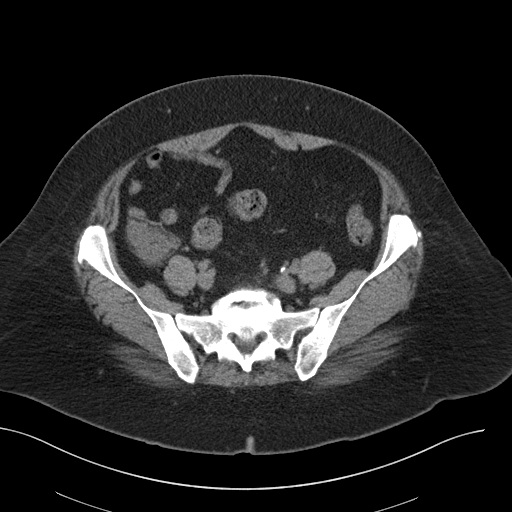
[im 45/101  soft-tissue]
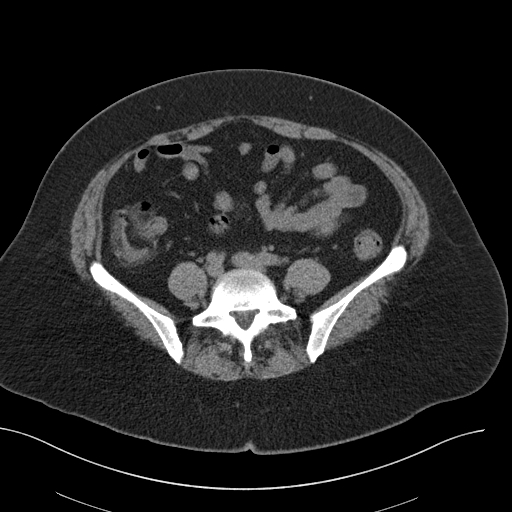
[im 56/101  soft-tissue]
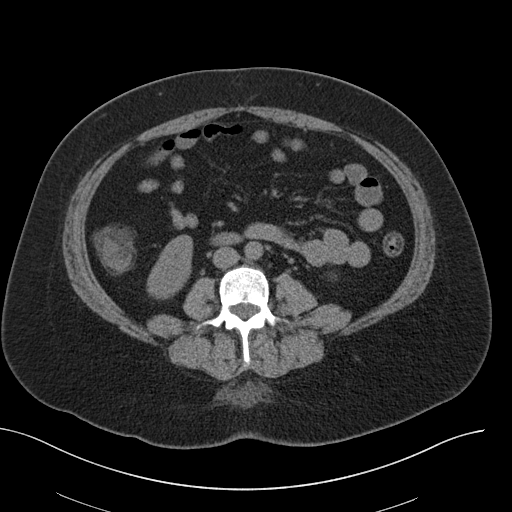
[im 62/101  soft-tissue]
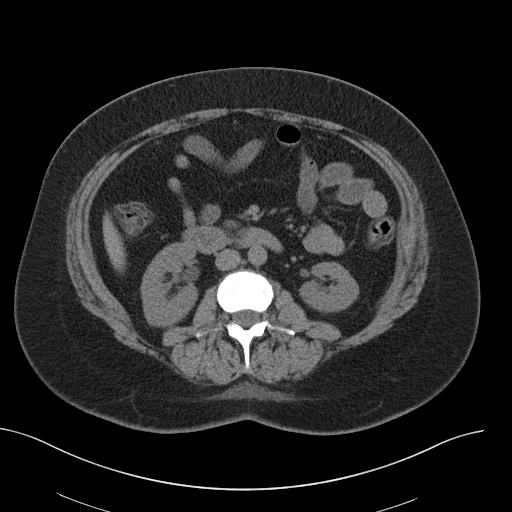
[im 62/101  bone]
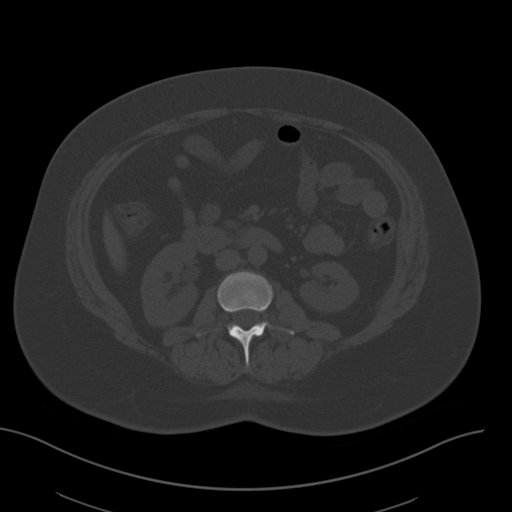
[im 67/101  soft-tissue]
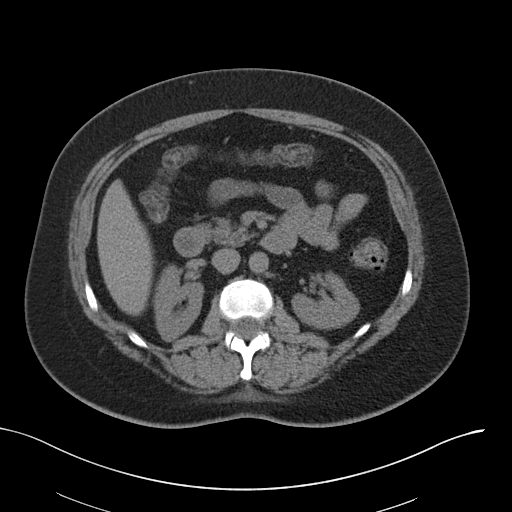
[im 73/101  soft-tissue]
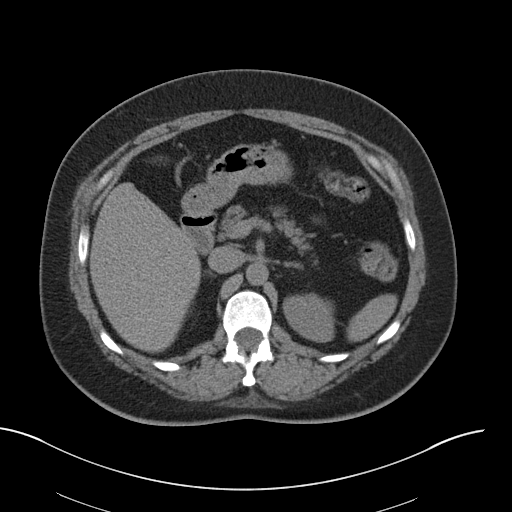
[im 78/101  soft-tissue]
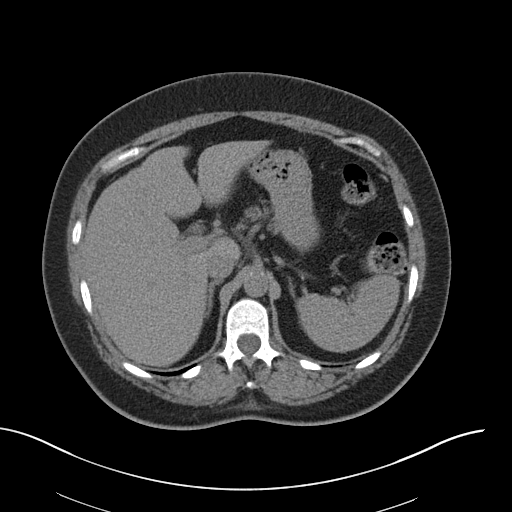
[im 89/101  soft-tissue]
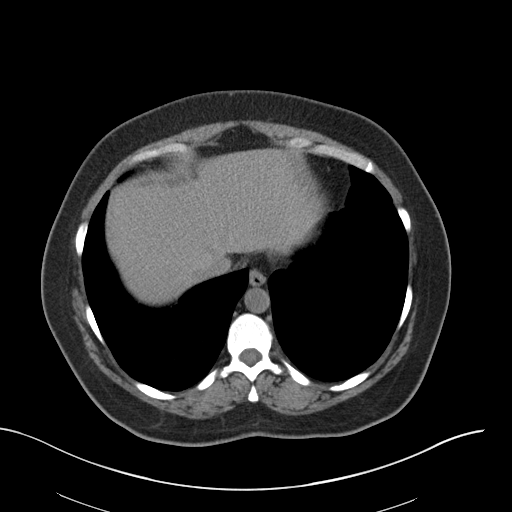
[im 95/101  soft-tissue]
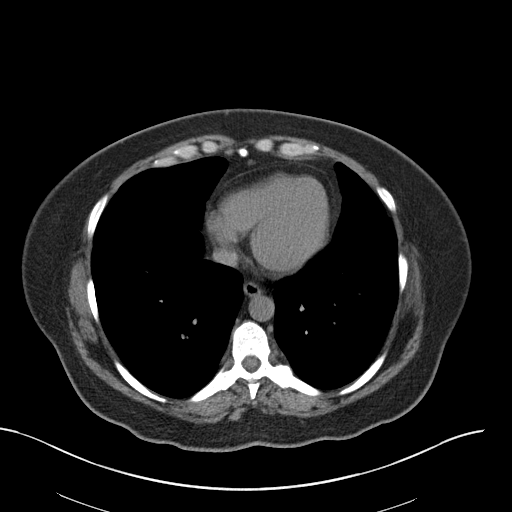

[Series 4: coronal · coronal · 0.79mm/px · 3 of 166 slices shown]
[im 56/166  soft-tissue]
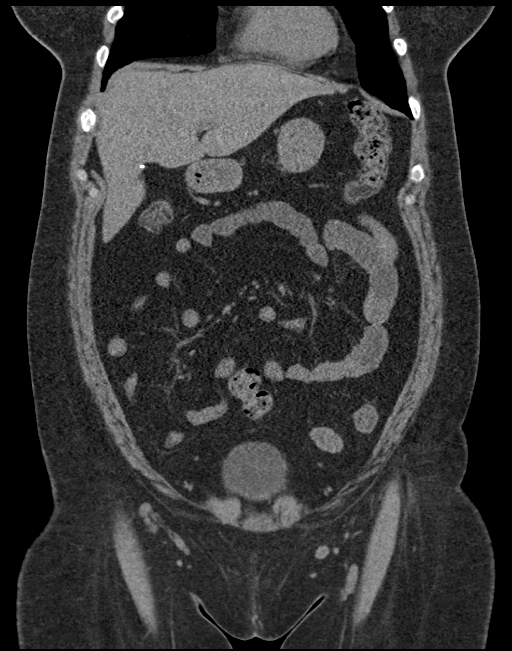
[im 74/166  soft-tissue]
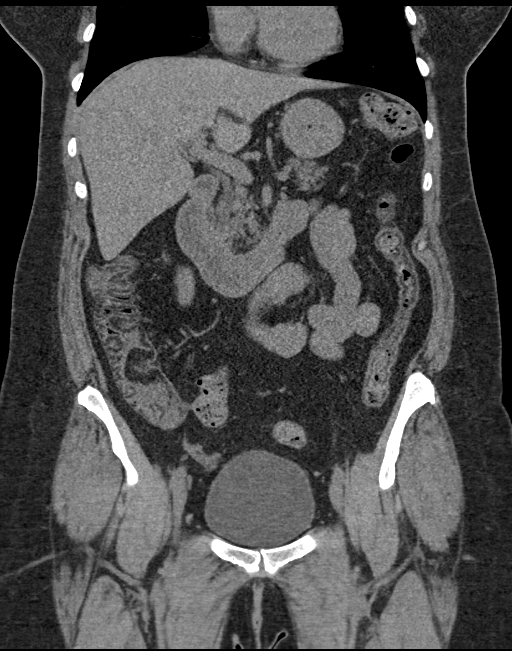
[im 92/166  soft-tissue]
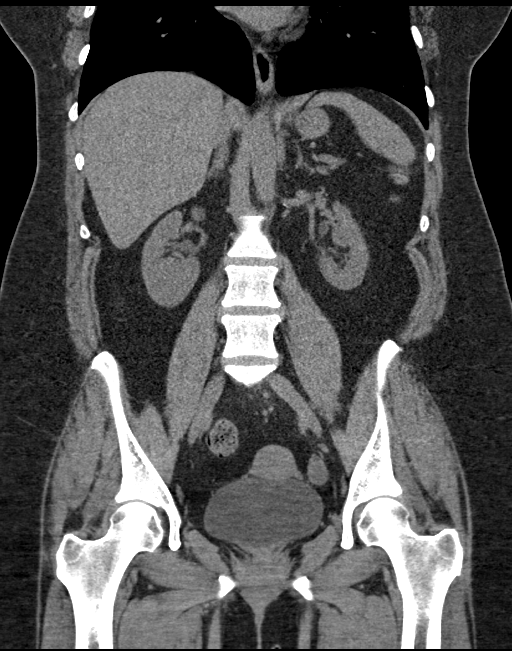

[17 of 46 positions shown; findings below may reference images not displayed]

FINDINGS: Lower chest: No acute abnormality.

Hepatobiliary: No focal liver abnormality is seen. Status post
cholecystectomy. No biliary dilatation.

Pancreas: Unremarkable.

Spleen: Unremarkable.

Adrenals/Urinary Tract: Adrenals, kidneys, and bladder are
unremarkable.

Stomach/Bowel: Stomach is within normal limits. Bowel is normal in
caliber. Colonic wall fat deposition, which may reflect chronic
inflammation.

Vascular/Lymphatic: Minimal aortic atherosclerosis.

Reproductive: Uterus and bilateral adnexa are unremarkable.

Other: There is no ascites.  Abdominal wall is unremarkable.

Musculoskeletal: Mild lower lumbar spine degenerative changes.
IMPRESSION: No acute abnormality or findings to account for reported symptoms.

## 2022-02-17 DIAGNOSIS — R2242 Localized swelling, mass and lump, left lower limb: Secondary | ICD-10-CM | POA: Diagnosis not present

## 2022-03-24 DIAGNOSIS — I10 Essential (primary) hypertension: Secondary | ICD-10-CM | POA: Diagnosis not present

## 2022-03-24 DIAGNOSIS — R739 Hyperglycemia, unspecified: Secondary | ICD-10-CM | POA: Diagnosis not present

## 2022-03-24 DIAGNOSIS — E559 Vitamin D deficiency, unspecified: Secondary | ICD-10-CM | POA: Diagnosis not present

## 2022-03-30 DIAGNOSIS — Z0000 Encounter for general adult medical examination without abnormal findings: Secondary | ICD-10-CM | POA: Diagnosis not present

## 2022-03-30 DIAGNOSIS — Z1339 Encounter for screening examination for other mental health and behavioral disorders: Secondary | ICD-10-CM | POA: Diagnosis not present

## 2022-03-30 DIAGNOSIS — Z1331 Encounter for screening for depression: Secondary | ICD-10-CM | POA: Diagnosis not present

## 2022-03-30 DIAGNOSIS — I10 Essential (primary) hypertension: Secondary | ICD-10-CM | POA: Diagnosis not present

## 2022-03-30 DIAGNOSIS — R82998 Other abnormal findings in urine: Secondary | ICD-10-CM | POA: Diagnosis not present

## 2022-04-03 ENCOUNTER — Other Ambulatory Visit: Payer: Self-pay | Admitting: Internal Medicine

## 2022-04-03 DIAGNOSIS — E781 Pure hyperglyceridemia: Secondary | ICD-10-CM

## 2022-04-10 ENCOUNTER — Other Ambulatory Visit: Payer: Self-pay | Admitting: Internal Medicine

## 2022-04-10 DIAGNOSIS — Z1231 Encounter for screening mammogram for malignant neoplasm of breast: Secondary | ICD-10-CM

## 2022-05-12 ENCOUNTER — Inpatient Hospital Stay: Admission: RE | Admit: 2022-05-12 | Payer: 59 | Source: Ambulatory Visit

## 2022-05-26 ENCOUNTER — Ambulatory Visit
Admission: RE | Admit: 2022-05-26 | Discharge: 2022-05-26 | Disposition: A | Discharge status: Home / Self Care | Payer: No Typology Code available for payment source | Source: Ambulatory Visit | Attending: Internal Medicine | Admitting: Internal Medicine

## 2022-05-26 DIAGNOSIS — E781 Pure hyperglyceridemia: Secondary | ICD-10-CM

## 2022-05-26 DIAGNOSIS — E7800 Pure hypercholesterolemia, unspecified: Secondary | ICD-10-CM | POA: Diagnosis not present

## 2022-07-27 ENCOUNTER — Encounter (HOSPITAL_BASED_OUTPATIENT_CLINIC_OR_DEPARTMENT_OTHER): Payer: Self-pay

## 2022-07-27 ENCOUNTER — Emergency Department (HOSPITAL_BASED_OUTPATIENT_CLINIC_OR_DEPARTMENT_OTHER)
Admission: EM | Admit: 2022-07-27 | Discharge: 2022-07-28 | Disposition: A | Discharge status: Home / Self Care | Payer: BC Managed Care – PPO | Attending: Emergency Medicine | Admitting: Emergency Medicine

## 2022-07-27 ENCOUNTER — Other Ambulatory Visit: Payer: Self-pay

## 2022-07-27 DIAGNOSIS — R067 Sneezing: Secondary | ICD-10-CM | POA: Insufficient documentation

## 2022-07-27 DIAGNOSIS — R0981 Nasal congestion: Secondary | ICD-10-CM

## 2022-07-27 DIAGNOSIS — H60391 Other infective otitis externa, right ear: Secondary | ICD-10-CM

## 2022-07-27 DIAGNOSIS — H9201 Otalgia, right ear: Secondary | ICD-10-CM | POA: Diagnosis not present

## 2022-07-27 MED ORDER — CIPROFLOXACIN-DEXAMETHASONE 0.3-0.1 % OT SUSP
4.0000 [drp] | Freq: Once | OTIC | Status: AC
  Administered 2022-07-27: 4 [drp] via OTIC
  Filled 2022-07-27: qty 7.5

## 2022-07-27 MED ORDER — AMOXICILLIN-POT CLAVULANATE 875-125 MG PO TABS
1.0000 | ORAL_TABLET | Freq: Once | ORAL | Status: AC
  Administered 2022-07-27: 1 via ORAL
  Filled 2022-07-27: qty 1

## 2022-07-27 MED ORDER — AMOXICILLIN-POT CLAVULANATE 875-125 MG PO TABS: 1.0000 | ORAL_TABLET | Freq: Two times a day (BID) | ORAL | 0 refills | Status: AC

## 2022-07-27 NOTE — Discharge Instructions (Addendum)
Continue using the ear drops twice a day for a week.

## 2022-07-27 NOTE — ED Triage Notes (Signed)
Right ear pain after sneezing.   Sinus congestion.

## 2022-07-27 NOTE — ED Provider Notes (Signed)
[facility] EMERGENCY DEPT  Provider Note  CSN: [id number] Arrival date & time: 07/27/22 2202  History Chief Complaint  Patient presents with   Ear Pain    Donna Freeman is a 51 y.o. female reports two days of nasal congestion, sinus pressure and sneezing. She reports sharp severe R ear pain after sneezing this evening, now with diminished hearing in that ear.    Home Medications Prior to Admission medications   Medication Sig Start Date End Date Taking? Authorizing Provider  amoxicillin-clavulanate (AUGMENTIN) 875-125 MG tablet Take 1 tablet by mouth every 12 (twelve) hours. 07/27/22  Yes Pollyann Savoy, MD  acetaminophen (TYLENOL 8 HOUR) 650 MG CR tablet Take 1 tablet (650 mg total) by mouth every 8 (eight) hours as needed for pain. 05/05/20   Mannie Stabile, PA-C  cetirizine (ZYRTEC) 10 MG tablet Take 10 mg by mouth daily.    [provider]  citalopram (CELEXA) 20 MG tablet Take 20 mg by mouth daily.    [provider]  etonogestrel (IMPLANON) 68 MG IMPL implant Inject 1 each into the skin once. Placed 12/07/2014    [provider]  fluconazole (DIFLUCAN) 150 MG tablet Take 1 tablet (150 mg total) by mouth once a week. 05/06/20   Wallis Bamberg, PA-C  losartan-hydrochlorothiazide (HYZAAR) 50-12.5 MG tablet Take 1 tablet by mouth daily.    [provider]  omeprazole (PRILOSEC) 20 MG capsule Take 20 mg by mouth daily.    [provider]     Allergies    Shellfish allergy, Other, and Phenergan [promethazine hcl]   Review of Systems   Review of Systems Please see HPI for pertinent positives and negatives  Physical Exam BP (!) 151/79   Pulse 82   Temp 99.2 F (37.3 C)   Resp 18   Ht 5\' 7"  (1.702 m)   Wt 97.5 kg   SpO2 99%   BMI 33.67 kg/m   Physical Exam Vitals and nursing note reviewed.  HENT:     Head: Normocephalic.     Left Ear: Tympanic membrane and ear canal normal.     Ears:     Comments: R  canal is erythematous and swollen, difficult to visualize TM    Nose: Congestion and rhinorrhea present.  Eyes:     Extraocular Movements: Extraocular movements intact.  Pulmonary:     Effort: Pulmonary effort is normal.  Musculoskeletal:        General: Normal range of motion.     Cervical back: Neck supple.  Skin:    Findings: No rash (on exposed skin).  Neurological:     Mental Status: She is alert and oriented to person, place, and time.  Psychiatric:        Mood and Affect: Mood normal.     ED Results / Procedures / Treatments   EKG None  Procedures Procedures  Medications Ordered in the ED Medications  ciprofloxacin-dexamethasone (CIPRODEX) 0.3-0.1 % OTIC (EAR) suspension 4 drop (has no administration in time range)  amoxicillin-clavulanate (AUGMENTIN) 875-125 MG per tablet 1 tablet (has no administration in time range)    Initial Impression and Plan  Patient here with R otalgia after sneezing. There is some swelling of her external canal making visualization of TM difficulty on that side. Some pain with manipulation of pinna, consistent with OE. Will also treat for OM given difficulty seeing her TM and her pain. Recommend outpatient ENT follow up.   ED Course  MDM Rules/Calculators/A&P Medical Decision Making Problems Addressed: Infective otitis externa of right ear: acute illness or injury Sinus congestion: acute illness or injury  Risk Prescription drug management.    Final Clinical Impression(s) / ED Diagnoses Final diagnoses:  Infective otitis externa of right ear  Sinus congestion    Rx / DC Orders ED Discharge Orders          Ordered    amoxicillin-clavulanate (AUGMENTIN) 875-125 MG tablet  Every 12 hours        07/27/22 2334             Pollyann Savoy, MD 07/27/22 2336

## 2022-08-02 DIAGNOSIS — I10 Essential (primary) hypertension: Secondary | ICD-10-CM | POA: Diagnosis not present

## 2022-08-02 DIAGNOSIS — H60501 Unspecified acute noninfective otitis externa, right ear: Secondary | ICD-10-CM | POA: Diagnosis not present

## 2022-08-28 ENCOUNTER — Inpatient Hospital Stay (HOSPITAL_COMMUNITY): Admission: RE | Admit: 2022-08-28 | Payer: Self-pay | Source: Ambulatory Visit

## 2022-08-28 ENCOUNTER — Encounter (HOSPITAL_COMMUNITY): Payer: Self-pay | Admitting: *Deleted

## 2022-08-28 ENCOUNTER — Other Ambulatory Visit: Payer: Self-pay

## 2022-08-28 ENCOUNTER — Ambulatory Visit (HOSPITAL_COMMUNITY)
Admission: EM | Admit: 2022-08-28 | Discharge: 2022-08-28 | Disposition: A | Discharge status: Home / Self Care | Payer: BC Managed Care – PPO | Attending: Internal Medicine | Admitting: Internal Medicine

## 2022-08-28 DIAGNOSIS — J0101 Acute recurrent maxillary sinusitis: Secondary | ICD-10-CM

## 2022-08-28 MED ORDER — PREDNISONE 10 MG (21) PO TBPK: ORAL_TABLET | Freq: Every day | ORAL | 0 refills | Status: AC

## 2022-08-28 MED ORDER — GUAIFENESIN ER 600 MG PO TB12: 600.0000 mg | ORAL_TABLET | Freq: Two times a day (BID) | ORAL | 0 refills | Status: AC

## 2022-08-28 MED ORDER — SALINE SPRAY 0.65 % NA SOLN: 1.0000 | NASAL | 0 refills | Status: AC | PRN

## 2022-08-28 MED ORDER — FLUTICASONE PROPIONATE 50 MCG/ACT NA SUSP: 1.0000 | Freq: Every day | NASAL | 0 refills | Status: AC

## 2022-08-28 NOTE — Discharge Instructions (Addendum)
Humidifier and vapor rub use will help with nasal congestion and postnasal drainage Please use medications as prescribed No indication for antibiotics at this time giving your recent antibiotic use Please keep your ENT appointment Return to urgent care if you have worsening symptoms Your lung exam is reassuring Smoke cessation will help with current symptoms.

## 2022-08-28 NOTE — ED Triage Notes (Signed)
Pt reports a cough and nasal congestion. Pt reports having the cough since SEPT.

## 2022-08-29 NOTE — ED Provider Notes (Signed)
[facility]    CSN: [id number] Arrival date & time: 08/28/22  1343      History   Chief Complaint Chief Complaint  Patient presents with   Cough   Nasal Congestion    HPI Donna Freeman is a 51 y.o. female comes to urgent care with nasal congestion and cough of 1 month duration.  Patient comes to urgent care with sinus pressure, pain and postnasal drainage associated with cough.  His symptoms started about 6 weeks ago.  She has been seen on a couple of occasions and has completed a course of amoxicillin and azithromycin.  According to the patient her symptoms has not improved significantly.  No fever or chills.  No headaches.  Patient denies using humidifier or nasal spray.  She is a smoker and smokes about half a pack of cigarettes a day for several years.  She denies any shortness of breath, wheezing or chest tightness.   HPI  Past Medical History:  Diagnosis Date   Anxiety    Dyslipidemia    GERD (gastroesophageal reflux disease)    Hypertension    PONV (postoperative nausea and vomiting)    hx of strep kidney that left scars    Von Willebrand disease (HCC)     Patient Active Problem List   Diagnosis Date Noted   OSA on CPAP 02/09/2020    Past Surgical History:  Procedure Laterality Date   CHOLECYSTECTOMY     LAPAROSCOPIC CHOLECYSTECTOMY SINGLE SITE WITH INTRAOPERATIVE CHOLANGIOGRAM N/A 12/30/2015   Procedure: LAPAROSCOPIC CHOLECYSTECTOMY SINGLE SITE WITH INTRAOPERATIVE CHOLANGIOGRAM;  Surgeon: Karie Soda, MD;  Location: [facility];  Service: General;  Laterality: N/A;   MOUTH SURGERY     TONSILLECTOMY     and adenoidectomy     OB History   No obstetric history on file.      Home Medications    Prior to Admission medications   Medication Sig Start Date End Date Taking? Authorizing Provider  fluticasone (FLONASE) 50 MCG/ACT nasal spray Place 1 spray into both nostrils daily. 08/28/22  Yes Hazle Ogburn, Britta Mccreedy, MD  guaiFENesin (MUCINEX) 600 MG 12 hr  tablet Take 1 tablet (600 mg total) by mouth 2 (two) times daily for 14 days. 08/28/22 09/11/22 Yes Valerye Kobus, Britta Mccreedy, MD  predniSONE (STERAPRED UNI-PAK 21 TAB) 10 MG (21) TBPK tablet Take by mouth daily. Take 6 tabs by mouth daily  for 2 days, then 5 tabs for 2 days, then 4 tabs for 2 days, then 3 tabs for 2 days, 2 tabs for 2 days, then 1 tab by mouth daily for 2 days 08/28/22  Yes Lakyla Biswas, Britta Mccreedy, MD  sodium chloride (OCEAN) 0.65 % SOLN nasal spray Place 1 spray into both nostrils as needed for congestion. 08/28/22  Yes Stryder Poitra, Britta Mccreedy, MD  acetaminophen (TYLENOL 8 HOUR) 650 MG CR tablet Take 1 tablet (650 mg total) by mouth every 8 (eight) hours as needed for pain. 05/05/20   Mannie Stabile, PA-C  amoxicillin-clavulanate (AUGMENTIN) 875-125 MG tablet Take 1 tablet by mouth every 12 (twelve) hours. 07/27/22   Pollyann Savoy, MD  cetirizine (ZYRTEC) 10 MG tablet Take 10 mg by mouth daily.    [provider]  citalopram (CELEXA) 20 MG tablet Take 20 mg by mouth daily.    [provider]  etonogestrel (IMPLANON) 68 MG IMPL implant Inject 1 each into the skin once. Placed 12/07/2014    [provider]  fluconazole (DIFLUCAN) 150 MG tablet Take 1  tablet (150 mg total) by mouth once a week. 05/06/20   Jaynee Eagles, PA-C  losartan-hydrochlorothiazide (HYZAAR) 50-12.5 MG tablet Take 1 tablet by mouth daily.    [provider]  omeprazole (PRILOSEC) 20 MG capsule Take 20 mg by mouth daily.    [provider]    Family History Family History  Problem Relation Age of Onset   Heart attack Mother     Social History Social History   Tobacco Use   Smoking status: Every Day    Packs/day: 1.00    Years: 35.00    Total pack years: 35.00    Types: Cigarettes   Smokeless tobacco: Never  Vaping Use   Vaping Use: Never used  Substance Use Topics   Alcohol use: Yes    Comment: 2 glasses of wine or beer every other day    Drug use: No     Allergies    Shellfish allergy, Other, and Phenergan [promethazine hcl]   Review of Systems Review of Systems  Constitutional: Negative.   HENT:  Positive for postnasal drip, rhinorrhea, sinus pressure and sinus pain. Negative for sore throat.   Respiratory:  Positive for cough. Negative for choking, shortness of breath and wheezing.   Cardiovascular:  Negative for chest pain.  Gastrointestinal: Negative.   Musculoskeletal: Negative.   Neurological: Negative.      Physical Exam Triage Vital Signs ED Triage Vitals [08/28/22 1409]  Enc Vitals Group     BP (!) 140/89     Pulse Rate 82     Resp 18     Temp 99.6 F (37.6 C)     Temp src      SpO2 95 %     Weight      Height      Head Circumference      Peak Flow      Pain Score      Pain Loc      Pain Edu?      Excl. in [place]?    No data found.  Updated Vital Signs BP (!) 140/89   Pulse 82   Temp 99.6 F (37.6 C)   Resp 18   SpO2 95%   Visual Acuity Right Eye Distance:   Left Eye Distance:   Bilateral Distance:    Right Eye Near:   Left Eye Near:    Bilateral Near:     Physical Exam Vitals and nursing note reviewed.  Constitutional:      General: She is not in acute distress.    Appearance: Normal appearance. She is not ill-appearing.  HENT:     Right Ear: Tympanic membrane normal.     Left Ear: Tympanic membrane normal.     Mouth/Throat:     Pharynx: No oropharyngeal exudate or posterior oropharyngeal erythema.  Cardiovascular:     Rate and Rhythm: Normal rate and regular rhythm.     Pulses: Normal pulses.     Heart sounds: Normal heart sounds.  Pulmonary:     Effort: Pulmonary effort is normal.     Breath sounds: Normal breath sounds.  Neurological:     Mental Status: She is alert.      UC Treatments / Results  Labs (all labs ordered are listed, but only abnormal results are displayed) Labs Reviewed - No data to display  EKG   Radiology No results found.  Procedures Procedures (including  critical care time)  Medications Ordered in UC Medications - No data to display  Initial  Impression / Assessment and Plan / UC Course  I have reviewed the triage vital signs and the nursing notes.  Pertinent labs & imaging results that were available during my care of the patient were reviewed by me and considered in my medical decision making (see chart for details).     1.  Acute recurrent sinusitis: Completed 2 courses of antibiotics Fluticasone nasal spray Tapering dose of steroids Humidifier and VapoRub use recommended Patient is encouraged to keep her ENT appointment scheduled for early October Smoke cessation will help with nasal symptoms Return precautions given. Final Clinical Impressions(s) / UC Diagnoses   Final diagnoses:  Acute recurrent maxillary sinusitis     Discharge Instructions      Humidifier and vapor rub use will help with nasal congestion and postnasal drainage Please use medications as prescribed No indication for antibiotics at this time giving your recent antibiotic use Please keep your ENT appointment Return to urgent care if you have worsening symptoms Your lung exam is reassuring Smoke cessation will help with current symptoms.   ED Prescriptions     Medication Sig Dispense Auth. Provider   predniSONE (STERAPRED UNI-PAK 21 TAB) 10 MG (21) TBPK tablet Take by mouth daily. Take 6 tabs by mouth daily  for 2 days, then 5 tabs for 2 days, then 4 tabs for 2 days, then 3 tabs for 2 days, 2 tabs for 2 days, then 1 tab by mouth daily for 2 days 42 tablet Camyla Camposano, Britta Mccreedy, MD   sodium chloride (OCEAN) 0.65 % SOLN nasal spray Place 1 spray into both nostrils as needed for congestion. 104 mL Olivya Sobol, Britta Mccreedy, MD   guaiFENesin (MUCINEX) 600 MG 12 hr tablet Take 1 tablet (600 mg total) by mouth 2 (two) times daily for 14 days. 28 tablet Ashwini Jago, Britta Mccreedy, MD   fluticasone (FLONASE) 50 MCG/ACT nasal spray Place 1 spray into both nostrils daily. 16 g  Vivion Romano, Britta Mccreedy, MD      PDMP not reviewed this encounter.   Merrilee Jansky, MD 08/29/22 [phone]

## 2022-08-31 ENCOUNTER — Ambulatory Visit (HOSPITAL_COMMUNITY)
Admission: RE | Admit: 2022-08-31 | Discharge: 2022-08-31 | Disposition: A | Discharge status: Home / Self Care | Payer: BC Managed Care – PPO | Source: Ambulatory Visit | Attending: Physician Assistant | Admitting: Physician Assistant

## 2022-08-31 ENCOUNTER — Encounter (HOSPITAL_COMMUNITY): Payer: Self-pay

## 2022-08-31 ENCOUNTER — Ambulatory Visit (INDEPENDENT_AMBULATORY_CARE_PROVIDER_SITE_OTHER): Payer: BC Managed Care – PPO

## 2022-08-31 VITALS — BP 152/84 | HR 82 | Temp 98.4°F | Resp 16

## 2022-08-31 DIAGNOSIS — R051 Acute cough: Secondary | ICD-10-CM | POA: Diagnosis not present

## 2022-08-31 DIAGNOSIS — R0602 Shortness of breath: Secondary | ICD-10-CM | POA: Diagnosis not present

## 2022-08-31 DIAGNOSIS — R059 Cough, unspecified: Secondary | ICD-10-CM | POA: Diagnosis not present

## 2022-08-31 MED ORDER — GUAIFENESIN-CODEINE 100-10 MG/5ML PO SOLN: 5.0000 mL | Freq: Three times a day (TID) | ORAL | 0 refills | Status: AC | PRN

## 2022-08-31 MED ORDER — ALBUTEROL SULFATE HFA 108 (90 BASE) MCG/ACT IN AERS: 1.0000 | INHALATION_SPRAY | Freq: Four times a day (QID) | RESPIRATORY_TRACT | 0 refills | Status: AC | PRN

## 2022-08-31 NOTE — ED Provider Notes (Signed)
[facility]    CSN: [id number] Arrival date & time: 08/31/22  1714      History   Chief Complaint Chief Complaint  Patient presents with   Cough    I was treated there Monday by Dr. Leonides Grills for my sinus infection and put on Prednisone.  My lungs feel like they are on fire now after walking short distances.  Do I need a chest x-ray? - Entered by patient    HPI Donna Freeman is a 51 y.o. female.   Pt seen in clinic three days ago for cough and congestion.  She was started on prednisone.  Reports her symptoms have gotten worse over the last few days.  Reports shortness of breath and chest tightness.  She has an albuterol inhaler that she has used in the last, but has run out.  She denies fevers.  She is worried she may have pneumonia.  She has an upcoming ENT visit.     Past Medical History:  Diagnosis Date   Anxiety    Dyslipidemia    GERD (gastroesophageal reflux disease)    Hypertension    PONV (postoperative nausea and vomiting)    hx of strep kidney that left scars    Von Willebrand disease (HCC)     Patient Active Problem List   Diagnosis Date Noted   OSA on CPAP 02/09/2020    Past Surgical History:  Procedure Laterality Date   CHOLECYSTECTOMY     LAPAROSCOPIC CHOLECYSTECTOMY SINGLE SITE WITH INTRAOPERATIVE CHOLANGIOGRAM N/A 12/30/2015   Procedure: LAPAROSCOPIC CHOLECYSTECTOMY SINGLE SITE WITH INTRAOPERATIVE CHOLANGIOGRAM;  Surgeon: Karie Soda, MD;  Location: [facility];  Service: General;  Laterality: N/A;   MOUTH SURGERY     TONSILLECTOMY     and adenoidectomy     OB History   No obstetric history on file.      Home Medications    Prior to Admission medications   Medication Sig Start Date End Date Taking? Authorizing Provider  acetaminophen (TYLENOL 8 HOUR) 650 MG CR tablet Take 1 tablet (650 mg total) by mouth every 8 (eight) hours as needed for pain. 05/05/20   Mannie Stabile, PA-C  albuterol (VENTOLIN HFA) 108 (90 Base) MCG/ACT  inhaler Inhale 1-2 puffs into the lungs every 6 (six) hours as needed for wheezing or shortness of breath. 08/31/22  Yes Ward, Tylene Fantasia, PA-C  amoxicillin-clavulanate (AUGMENTIN) 875-125 MG tablet Take 1 tablet by mouth every 12 (twelve) hours. 07/27/22   Pollyann Savoy, MD  cetirizine (ZYRTEC) 10 MG tablet Take 10 mg by mouth daily.    [provider]  citalopram (CELEXA) 20 MG tablet Take 20 mg by mouth daily.    [provider]  etonogestrel (IMPLANON) 68 MG IMPL implant Inject 1 each into the skin once. Placed 12/07/2014    [provider]  fluconazole (DIFLUCAN) 150 MG tablet Take 1 tablet (150 mg total) by mouth once a week. 05/06/20   Wallis Bamberg, PA-C  fluticasone (FLONASE) 50 MCG/ACT nasal spray Place 1 spray into both nostrils daily. 08/28/22   Lamptey, Britta Mccreedy, MD  guaiFENesin (MUCINEX) 600 MG 12 hr tablet Take 1 tablet (600 mg total) by mouth 2 (two) times daily for 14 days. 08/28/22 09/11/22  LampteyBritta Mccreedy, MD  guaiFENesin-codeine 100-10 MG/5ML syrup Take 5 mLs by mouth 3 (three) times daily as needed for cough. 08/31/22  Yes Ward, Tylene Fantasia, PA-C  losartan-hydrochlorothiazide (HYZAAR) 50-12.5 MG tablet Take 1 tablet by mouth daily.  [provider]  omeprazole (PRILOSEC) 20 MG capsule Take 20 mg by mouth daily.    [provider]  predniSONE (STERAPRED UNI-PAK 21 TAB) 10 MG (21) TBPK tablet Take by mouth daily. Take 6 tabs by mouth daily  for 2 days, then 5 tabs for 2 days, then 4 tabs for 2 days, then 3 tabs for 2 days, 2 tabs for 2 days, then 1 tab by mouth daily for 2 days 08/28/22   Chase Picket, MD  sodium chloride (OCEAN) 0.65 % SOLN nasal spray Place 1 spray into both nostrils as needed for congestion. 08/28/22   LampteyMyrene Galas, MD    Family History Family History  Problem Relation Age of Onset   Heart attack Mother     Social History Social History   Tobacco Use   Smoking status: Every Day    Packs/day: 1.00     Years: 35.00    Total pack years: 35.00    Types: Cigarettes   Smokeless tobacco: Never  Vaping Use   Vaping Use: Never used  Substance Use Topics   Alcohol use: Yes    Comment: 2 glasses of wine or beer every other day    Drug use: No     Allergies   Shellfish allergy, Other, and Phenergan [promethazine hcl]   Review of Systems Review of Systems  Constitutional:  Negative for chills and fever.  HENT:  Positive for congestion. Negative for ear pain and sore throat.   Eyes:  Negative for pain and visual disturbance.  Respiratory:  Positive for cough. Negative for shortness of breath.   Cardiovascular:  Negative for chest pain and palpitations.  Gastrointestinal:  Negative for abdominal pain and vomiting.  Genitourinary:  Negative for dysuria and hematuria.  Musculoskeletal:  Negative for arthralgias and back pain.  Skin:  Negative for color change and rash.  Neurological:  Negative for seizures and syncope.  All other systems reviewed and are negative.    Physical Exam Triage Vital Signs ED Triage Vitals [08/31/22 1739]  Enc Vitals Group     BP (!) 152/84     Pulse Rate 82     Resp 16     Temp 98.4 F (36.9 C)     Temp Source Oral     SpO2 98 %     Weight      Height      Head Circumference      Peak Flow      Pain Score      Pain Loc      Pain Edu?      Excl. in [place]?    No data found.  Updated Vital Signs BP (!) 152/84 (BP Location: Left Arm)   Pulse 82   Temp 98.4 F (36.9 C) (Oral)   Resp 16   SpO2 98%   Visual Acuity Right Eye Distance:   Left Eye Distance:   Bilateral Distance:    Right Eye Near:   Left Eye Near:    Bilateral Near:     Physical Exam Vitals and nursing note reviewed.  Constitutional:      General: She is not in acute distress.    Appearance: She is well-developed.  HENT:     Head: Normocephalic and atraumatic.  Eyes:     Conjunctiva/sclera: Conjunctivae normal.  Cardiovascular:     Rate and Rhythm: Normal rate and  regular rhythm.     Heart sounds: No murmur heard. Pulmonary:  Effort: Pulmonary effort is normal. No respiratory distress.     Breath sounds: Examination of the right-upper field reveals rhonchi. Examination of the left-upper field reveals rhonchi. Examination of the right-middle field reveals rhonchi. Examination of the left-middle field reveals rhonchi. Rhonchi present.  Abdominal:     Palpations: Abdomen is soft.     Tenderness: There is no abdominal tenderness.  Musculoskeletal:        General: No swelling.     Cervical back: Neck supple.  Skin:    General: Skin is warm and dry.     Capillary Refill: Capillary refill takes less than 2 seconds.  Neurological:     Mental Status: She is alert.  Psychiatric:        Mood and Affect: Mood normal.      UC Treatments / Results  Labs (all labs ordered are listed, but only abnormal results are displayed) Labs Reviewed - No data to display  EKG   Radiology DG Chest 2 View  Result Date: 08/31/2022 CLINICAL DATA:  Cough, shortness of breath EXAM: CHEST - 2 VIEW COMPARISON:  Chest radiographs dated 12/26/2015 FINDINGS: Lungs are clear.  No pleural effusion or pneumothorax. The heart is normal in size. Visualized osseous structures are within normal limits. IMPRESSION: Normal chest radiographs. Electronically Signed   By: Charline Bills M.D.   On: 08/31/2022 18:34    Procedures Procedures (including critical care time)  Medications Ordered in UC Medications - No data to display  Initial Impression / Assessment and Plan / UC Course  I have reviewed the triage vital signs and the nursing notes.  Pertinent labs & imaging results that were available during my care of the patient were reviewed by me and considered in my medical decision making (see chart for details).     Chest xray negative.  Will refill her albuterol inhaler, cough syrup prescribed.  Advised to complete prednisone.  Follow up with ENT as planned.  Final  Clinical Impressions(s) / UC Diagnoses   Final diagnoses:  Acute cough     Discharge Instructions      Use inhaler as needed for shortness of breath or wheezing Take cough syrup as needed for cough Drink plenty of fluids Continue prednisone as prescribed    ED Prescriptions     Medication Sig Dispense Auth. Provider   albuterol (VENTOLIN HFA) 108 (90 Base) MCG/ACT inhaler Inhale 1-2 puffs into the lungs every 6 (six) hours as needed for wheezing or shortness of breath. 1 each Ward, Tylene Fantasia, PA-C   guaiFENesin-codeine 100-10 MG/5ML syrup Take 5 mLs by mouth 3 (three) times daily as needed for cough. 120 mL Ward, Tylene Fantasia, PA-C      I have reviewed the PDMP during this encounter.   Ward, Tylene Fantasia, PA-C 08/31/22 1857

## 2022-08-31 NOTE — ED Triage Notes (Signed)
Pt reports cough is not any better since starting the prednisone X 2-3 DAYS.

## 2022-08-31 NOTE — Discharge Instructions (Signed)
Use inhaler as needed for shortness of breath or wheezing Take cough syrup as needed for cough Drink plenty of fluids Continue prednisone as prescribed

## 2022-09-27 DIAGNOSIS — H6991 Unspecified Eustachian tube disorder, right ear: Secondary | ICD-10-CM | POA: Diagnosis not present

## 2022-09-27 DIAGNOSIS — H6121 Impacted cerumen, right ear: Secondary | ICD-10-CM | POA: Diagnosis not present

## 2022-10-24 DIAGNOSIS — H6991 Unspecified Eustachian tube disorder, right ear: Secondary | ICD-10-CM | POA: Diagnosis not present

## 2023-04-06 DIAGNOSIS — K219 Gastro-esophageal reflux disease without esophagitis: Secondary | ICD-10-CM | POA: Diagnosis not present

## 2023-04-06 DIAGNOSIS — I10 Essential (primary) hypertension: Secondary | ICD-10-CM | POA: Diagnosis not present

## 2023-04-06 DIAGNOSIS — R7989 Other specified abnormal findings of blood chemistry: Secondary | ICD-10-CM | POA: Diagnosis not present

## 2023-04-06 DIAGNOSIS — E781 Pure hyperglyceridemia: Secondary | ICD-10-CM | POA: Diagnosis not present

## 2023-04-06 DIAGNOSIS — E559 Vitamin D deficiency, unspecified: Secondary | ICD-10-CM | POA: Diagnosis not present

## 2023-04-06 DIAGNOSIS — R739 Hyperglycemia, unspecified: Secondary | ICD-10-CM | POA: Diagnosis not present

## 2023-04-12 DIAGNOSIS — I10 Essential (primary) hypertension: Secondary | ICD-10-CM | POA: Diagnosis not present

## 2023-04-12 DIAGNOSIS — Z1331 Encounter for screening for depression: Secondary | ICD-10-CM | POA: Diagnosis not present

## 2023-04-12 DIAGNOSIS — Z1339 Encounter for screening examination for other mental health and behavioral disorders: Secondary | ICD-10-CM | POA: Diagnosis not present

## 2023-04-12 DIAGNOSIS — F331 Major depressive disorder, recurrent, moderate: Secondary | ICD-10-CM | POA: Diagnosis not present

## 2023-04-12 DIAGNOSIS — G4733 Obstructive sleep apnea (adult) (pediatric): Secondary | ICD-10-CM | POA: Diagnosis not present

## 2023-04-12 DIAGNOSIS — Z0000 Encounter for general adult medical examination without abnormal findings: Secondary | ICD-10-CM | POA: Diagnosis not present

## 2023-04-12 DIAGNOSIS — F17200 Nicotine dependence, unspecified, uncomplicated: Secondary | ICD-10-CM | POA: Diagnosis not present

## 2023-04-13 ENCOUNTER — Other Ambulatory Visit: Payer: Self-pay | Admitting: Internal Medicine

## 2023-04-13 DIAGNOSIS — Z0000 Encounter for general adult medical examination without abnormal findings: Secondary | ICD-10-CM

## 2023-05-16 ENCOUNTER — Ambulatory Visit
Admission: RE | Admit: 2023-05-16 | Discharge: 2023-05-16 | Disposition: A | Discharge status: Home / Self Care | Payer: BC Managed Care – PPO | Source: Ambulatory Visit | Attending: Internal Medicine | Admitting: Internal Medicine

## 2023-05-16 DIAGNOSIS — Z1231 Encounter for screening mammogram for malignant neoplasm of breast: Secondary | ICD-10-CM | POA: Diagnosis not present

## 2023-05-16 DIAGNOSIS — Z0000 Encounter for general adult medical examination without abnormal findings: Secondary | ICD-10-CM

## 2023-08-03 DIAGNOSIS — L821 Other seborrheic keratosis: Secondary | ICD-10-CM | POA: Diagnosis not present

## 2023-08-03 DIAGNOSIS — L299 Pruritus, unspecified: Secondary | ICD-10-CM | POA: Diagnosis not present

## 2023-08-03 DIAGNOSIS — Z23 Encounter for immunization: Secondary | ICD-10-CM | POA: Diagnosis not present

## 2024-05-21 DIAGNOSIS — E559 Vitamin D deficiency, unspecified: Secondary | ICD-10-CM | POA: Diagnosis not present

## 2024-05-21 DIAGNOSIS — I10 Essential (primary) hypertension: Secondary | ICD-10-CM | POA: Diagnosis not present

## 2024-05-21 DIAGNOSIS — R739 Hyperglycemia, unspecified: Secondary | ICD-10-CM | POA: Diagnosis not present

## 2024-05-28 DIAGNOSIS — Z0000 Encounter for general adult medical examination without abnormal findings: Secondary | ICD-10-CM | POA: Diagnosis not present

## 2024-05-28 DIAGNOSIS — Z1331 Encounter for screening for depression: Secondary | ICD-10-CM | POA: Diagnosis not present

## 2024-05-28 DIAGNOSIS — Z1339 Encounter for screening examination for other mental health and behavioral disorders: Secondary | ICD-10-CM | POA: Diagnosis not present

## 2024-05-28 DIAGNOSIS — R82998 Other abnormal findings in urine: Secondary | ICD-10-CM | POA: Diagnosis not present

## 2024-05-28 DIAGNOSIS — I10 Essential (primary) hypertension: Secondary | ICD-10-CM | POA: Diagnosis not present
